# Patient Record
Sex: Female | Born: 1965 | Race: White | Hispanic: No | Marital: Married | Smoking: Never smoker
Health system: Southern US, Community
[De-identification: ages and names within clinical notes are randomized; demographics above are authoritative.]

## PROBLEM LIST (undated history)

## (undated) DIAGNOSIS — J45909 Unspecified asthma, uncomplicated: Secondary | ICD-10-CM

## (undated) DIAGNOSIS — B009 Herpesviral infection, unspecified: Secondary | ICD-10-CM

## (undated) DIAGNOSIS — K649 Unspecified hemorrhoids: Secondary | ICD-10-CM

## (undated) DIAGNOSIS — T7840XA Allergy, unspecified, initial encounter: Secondary | ICD-10-CM

## (undated) HISTORY — PX: BREAST ENHANCEMENT SURGERY: SHX7

## (undated) HISTORY — PX: DILATION AND CURETTAGE OF UTERUS: SHX78

## (undated) HISTORY — DX: Unspecified asthma, uncomplicated: J45.909

## (undated) HISTORY — DX: Unspecified hemorrhoids: K64.9

## (undated) HISTORY — DX: Allergy, unspecified, initial encounter: T78.40XA

## (undated) HISTORY — DX: Herpesviral infection, unspecified: B00.9

---

## 1994-10-29 HISTORY — PX: AUGMENTATION MAMMAPLASTY: SUR837

## 2005-10-01 ENCOUNTER — Encounter (INDEPENDENT_AMBULATORY_CARE_PROVIDER_SITE_OTHER): Payer: Self-pay | Admitting: *Deleted

## 2005-10-01 ENCOUNTER — Ambulatory Visit (HOSPITAL_COMMUNITY): Admission: RE | Admit: 2005-10-01 | Discharge: 2005-10-01 | Payer: Self-pay | Admitting: Obstetrics and Gynecology

## 2007-11-07 ENCOUNTER — Ambulatory Visit (HOSPITAL_COMMUNITY): Admission: RE | Admit: 2007-11-07 | Discharge: 2007-11-07 | Payer: Self-pay | Admitting: Obstetrics and Gynecology

## 2007-11-14 ENCOUNTER — Ambulatory Visit (HOSPITAL_COMMUNITY): Admission: RE | Admit: 2007-11-14 | Discharge: 2007-11-14 | Payer: Self-pay | Admitting: Obstetrics and Gynecology

## 2007-11-14 ENCOUNTER — Encounter (INDEPENDENT_AMBULATORY_CARE_PROVIDER_SITE_OTHER): Payer: Self-pay | Admitting: Obstetrics and Gynecology

## 2008-01-07 ENCOUNTER — Encounter: Admission: RE | Admit: 2008-01-07 | Discharge: 2008-01-07 | Payer: Self-pay | Admitting: Obstetrics and Gynecology

## 2009-03-18 ENCOUNTER — Encounter: Admission: RE | Admit: 2009-03-18 | Discharge: 2009-03-18 | Payer: Self-pay | Admitting: Obstetrics and Gynecology

## 2010-08-02 ENCOUNTER — Encounter: Admission: RE | Admit: 2010-08-02 | Discharge: 2010-08-02 | Payer: Self-pay | Admitting: Obstetrics and Gynecology

## 2010-11-19 ENCOUNTER — Encounter: Payer: Self-pay | Admitting: Obstetrics and Gynecology

## 2011-03-13 NOTE — Op Note (Signed)
NAME:  Tracey Faulkner, Tracey Faulkner NO.:  [id number]   MEDICAL RECORD NO.:  [id number]          PATIENT TYPE:  AMB   LOCATION:  SDC                           FACILITY:  [facility]   PHYSICIAN:  Carrington Clamp, M.D. 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   POSTOPERATIVE DIAGNOSES:  Missed abortion, twins, suspect molar  pregnancy.   PROCEDURE:  Dilation and evacuation.   SURGEON:  Carrington Clamp, M.D.   ASSISTANT:  None.   ANESTHESIA:  MAC.   FINDINGS:  About a 10-week size uterus down to 8 weeks size postop with  good crie.   SPECIMENS:  Uterine contents.  These were marked to pathology with the  note to check entire specimen for molar pregnancy.   ESTIMATED BLOOD LOSS:  50 mL.   IV FLUIDS:  700 mL.   URINE OUTPUT:  Not measured.   COMPLICATIONS:  None.   MEDICATIONS:  Methergine.   TECHNIQUE:  After adequate LMA anesthesia was achieved, the patient was  prepped and draped in the usual sterile fashion in the dorsal lithotomy  position.  The bladder was emptied with a red rubber catheter and the  speculum placed in the vagina.  A single-tooth tenaculum was used to  stabilize the cervix and the cervix was dilated with Shawnie Pons dilators.  A  10 mm curette was passed into the uterus and suction curettage was  performed.  Alternating  sharp and suction curettage revealed that all the products had been  removed and was good crie.  The patient was given a dose of Methergine  and all instruments were withdrawn from the vagina and the patient  returned to the recovery room in stable condition.      Carrington Clamp, M.D.  Electronically Signed     MH/MEDQ  D:  11/14/2007  T:  11/14/2007  Job:  [id number]

## 2011-03-16 NOTE — Op Note (Signed)
NAME:  Tracey Faulkner, Tracey Faulkner NO.:  [id number]   MEDICAL RECORD NO.:  [id number]          PATIENT TYPE:  AMB   LOCATION:  SDC                           FACILITY:  [facility]   PHYSICIAN:  Carrington Clamp, M.D. DATE OF BIRTH:  1965-12-31   DATE OF PROCEDURE:  10/01/2005  DATE OF DISCHARGE:                                 OPERATIVE REPORT   PREOPERATIVE DIAGNOSIS:  Missed AB.   POSTOPERATIVE DIAGNOSIS:  Missed AB.   OPERATION:  Is suction dilation and evacuation.   SURGEON:  Carrington Clamp, M.D.   ASSISTANT:  None.   ANESTHESIA:  Is general.   ESTIMATED BLOOD LOSS:  Is 100 mL.   FLUIDS REPLACED:  400 mL. Urine was not measured.   SPECIMENS:  Uterine contents.   COMPLICATIONS:  None.   FINDINGS:  8-week size uterus down to 6-week size postoperatively with good  crie.   MEDICATIONS:  Ancef and Methergine.   Counts were correct x3.   TECHNIQUE:  After adequate general anesthesia was achieved, the patient was  prepped and draped in usual sterile fashion in dorsal lithotomy position.  After the bladder had been emptied with red rubber catheter, a speculum was  placed in the vagina and the cervix dilated up with Shawnie Pons dilators. A 9 mm  suction curette was then placed into the uterus and suction curettage  performed. Alternating suction and sharp curettage was performed until good  crie was obtained. All instruments were then withdrawn from the vagina and  the patient was returned to recovery room in stable condition.     Carrington Clamp, M.D.  Electronically Signed    MH/MEDQ  D:  10/01/2005  T:  10/01/2005  Job:  [id number]

## 2011-06-27 ENCOUNTER — Other Ambulatory Visit: Payer: Self-pay | Admitting: Obstetrics and Gynecology

## 2011-06-27 DIAGNOSIS — Z1231 Encounter for screening mammogram for malignant neoplasm of breast: Secondary | ICD-10-CM

## 2011-07-19 LAB — URINALYSIS, ROUTINE W REFLEX MICROSCOPIC
Bilirubin Urine: NEGATIVE
Glucose, UA: NEGATIVE
Hgb urine dipstick: NEGATIVE
Ketones, ur: NEGATIVE
Nitrite: NEGATIVE
Protein, ur: NEGATIVE
Specific Gravity, Urine: 1.02
Urobilinogen, UA: 0.2
pH: 7

## 2011-07-19 LAB — CBC
HCT: 37.9
Hemoglobin: 13.1
MCHC: 34.6
MCV: 96.8
Platelets: 191
RBC: 3.92
RDW: 13.5
WBC: 5.3

## 2011-07-19 LAB — ABO/RH: ABO/RH(D): A POS

## 2011-08-13 ENCOUNTER — Ambulatory Visit: Payer: Self-pay

## 2011-08-24 ENCOUNTER — Ambulatory Visit
Admission: RE | Admit: 2011-08-24 | Discharge: 2011-08-24 | Disposition: A | Discharge status: Home / Self Care | Payer: BC Managed Care – PPO | Source: Ambulatory Visit | Attending: Obstetrics and Gynecology | Admitting: Obstetrics and Gynecology

## 2011-08-24 DIAGNOSIS — Z1231 Encounter for screening mammogram for malignant neoplasm of breast: Secondary | ICD-10-CM

## 2012-02-27 LAB — HM MAMMOGRAPHY

## 2012-03-04 ENCOUNTER — Ambulatory Visit (HOSPITAL_BASED_OUTPATIENT_CLINIC_OR_DEPARTMENT_OTHER)
Admission: RE | Admit: 2012-03-04 | Discharge: 2012-03-04 | Disposition: A | Discharge status: Home / Self Care | Payer: BC Managed Care – PPO | Source: Ambulatory Visit | Attending: Family Medicine | Admitting: Family Medicine

## 2012-03-04 ENCOUNTER — Other Ambulatory Visit (HOSPITAL_BASED_OUTPATIENT_CLINIC_OR_DEPARTMENT_OTHER): Payer: Self-pay | Admitting: Family Medicine

## 2012-03-04 DIAGNOSIS — J069 Acute upper respiratory infection, unspecified: Secondary | ICD-10-CM

## 2012-03-04 DIAGNOSIS — R042 Hemoptysis: Secondary | ICD-10-CM

## 2012-03-04 DIAGNOSIS — R918 Other nonspecific abnormal finding of lung field: Secondary | ICD-10-CM

## 2012-03-04 DIAGNOSIS — J45909 Unspecified asthma, uncomplicated: Secondary | ICD-10-CM | POA: Insufficient documentation

## 2012-03-04 DIAGNOSIS — R05 Cough: Secondary | ICD-10-CM

## 2012-03-04 DIAGNOSIS — R059 Cough, unspecified: Secondary | ICD-10-CM | POA: Insufficient documentation

## 2012-04-07 ENCOUNTER — Other Ambulatory Visit (HOSPITAL_BASED_OUTPATIENT_CLINIC_OR_DEPARTMENT_OTHER): Payer: Self-pay | Admitting: Family Medicine

## 2012-04-07 DIAGNOSIS — R9389 Abnormal findings on diagnostic imaging of other specified body structures: Secondary | ICD-10-CM

## 2012-04-09 ENCOUNTER — Ambulatory Visit (HOSPITAL_BASED_OUTPATIENT_CLINIC_OR_DEPARTMENT_OTHER)
Admission: RE | Admit: 2012-04-09 | Discharge: 2012-04-09 | Disposition: A | Discharge status: Home / Self Care | Payer: BC Managed Care – PPO | Source: Ambulatory Visit | Attending: Family Medicine | Admitting: Family Medicine

## 2012-04-09 DIAGNOSIS — R9389 Abnormal findings on diagnostic imaging of other specified body structures: Secondary | ICD-10-CM

## 2012-05-09 ENCOUNTER — Other Ambulatory Visit: Payer: Self-pay | Admitting: Obstetrics and Gynecology

## 2012-05-09 LAB — HM PAP SMEAR

## 2012-07-16 ENCOUNTER — Other Ambulatory Visit: Payer: Self-pay | Admitting: Obstetrics and Gynecology

## 2012-07-16 DIAGNOSIS — Z1231 Encounter for screening mammogram for malignant neoplasm of breast: Secondary | ICD-10-CM

## 2012-07-16 DIAGNOSIS — Z9882 Breast implant status: Secondary | ICD-10-CM

## 2012-08-25 ENCOUNTER — Ambulatory Visit
Admission: RE | Admit: 2012-08-25 | Discharge: 2012-08-25 | Disposition: A | Discharge status: Home / Self Care | Payer: BC Managed Care – PPO | Source: Ambulatory Visit | Attending: Obstetrics and Gynecology | Admitting: Obstetrics and Gynecology

## 2012-08-25 DIAGNOSIS — Z1231 Encounter for screening mammogram for malignant neoplasm of breast: Secondary | ICD-10-CM

## 2012-08-25 DIAGNOSIS — Z9882 Breast implant status: Secondary | ICD-10-CM

## 2013-03-16 ENCOUNTER — Ambulatory Visit (INDEPENDENT_AMBULATORY_CARE_PROVIDER_SITE_OTHER): Payer: 59 | Admitting: Family Medicine

## 2013-03-16 ENCOUNTER — Encounter: Payer: Self-pay | Admitting: Family Medicine

## 2013-03-16 VITALS — BP 96/58 | HR 73 | Resp 16 | Ht 63.0 in | Wt 153.0 lb

## 2013-03-16 DIAGNOSIS — B009 Herpesviral infection, unspecified: Secondary | ICD-10-CM

## 2013-03-16 DIAGNOSIS — E663 Overweight: Secondary | ICD-10-CM

## 2013-03-16 DIAGNOSIS — J45909 Unspecified asthma, uncomplicated: Secondary | ICD-10-CM

## 2013-03-16 DIAGNOSIS — K649 Unspecified hemorrhoids: Secondary | ICD-10-CM

## 2013-03-16 MED ORDER — FAMCICLOVIR 500 MG PO TABS: ORAL_TABLET | ORAL | Status: DC

## 2013-03-16 MED ORDER — ALBUTEROL SULFATE HFA 108 (90 BASE) MCG/ACT IN AERS: 2.0000 | INHALATION_SPRAY | Freq: Four times a day (QID) | RESPIRATORY_TRACT | Status: DC | PRN

## 2013-03-16 MED ORDER — FLUTICASONE-SALMETEROL 250-50 MCG/DOSE IN AEPB: 1.0000 | INHALATION_SPRAY | Freq: Two times a day (BID) | RESPIRATORY_TRACT | Status: AC

## 2013-03-16 MED ORDER — FLUTICASONE-SALMETEROL 100-50 MCG/DOSE IN AEPB: 1.0000 | INHALATION_SPRAY | Freq: Two times a day (BID) | RESPIRATORY_TRACT | Status: AC

## 2013-03-16 NOTE — Progress Notes (Signed)
  Subjective:    Patient ID: Tracey Faulkner, female    DOB: [birth year], 47 y.o.   MRN: [id number]  HPI  Tracey Faulkner is here today to discuss a few issues.    1)  Hemorrhoids:  She has been struggling with this problem for several days.  She has used Preparation H which has helped her some.    2)  Asthma:  She has run out of her asthma medications.     3)  Weight:  She wants to start another round of HCG.  She has been trying to exercise and watch her calorie intake but has gained weight.    Review of Systems  Constitutional: Positive for activity change, appetite change, fatigue and unexpected weight change.  Respiratory: Positive for chest tightness and wheezing.   Cardiovascular: Negative for chest pain and palpitations.   Past Medical History  Diagnosis Date  . Asthma   . HSV-2 infection     Right Ear   . Hemorrhoids     Family History  Problem Relation Age of Onset  . Asthma Sister    History   Social History Narrative   Marital Status:  Married Educational psychologist)   Children:  Sons Romeo Apple, Arthur)    Pets: None   Living Situation: Lives with  spouse and son.     Occupation: Psychologist, sport and exercise Contractor)   Education: Doctor, general practice   Tobacco Use/Exposure:  None    Alcohol Use:  Occasional   Drug Use:  None   Diet:  Regular   Exercise:  None   Hobbies: Business                Objective:   Physical Exam  Constitutional: She appears well-nourished. No distress.  HENT:  Head: Normocephalic.  Mouth/Throat: No oropharyngeal exudate.  Eyes: Conjunctivae are normal. Right eye exhibits no discharge. Left eye exhibits no discharge.  Neck: Neck supple.  Cardiovascular: Normal rate, regular rhythm and normal heart sounds.  Exam reveals no gallop and no friction rub.   No murmur heard. Pulmonary/Chest: Effort normal and breath sounds normal. She has no wheezes. She exhibits no tenderness.  Lymphadenopathy:    She has no cervical adenopathy.  Neurological: She is alert.   Skin: Skin is warm and dry. No rash noted.  Psychiatric: She has a normal mood and affect.      Assessment & Plan:  TIME 30 MINUTES:  MORE THAN 50 % OF TIME WAS INVOLVED IN COUNSELING.

## 2013-03-24 ENCOUNTER — Ambulatory Visit: Payer: 59 | Admitting: *Deleted

## 2013-03-24 VITALS — Wt 147.0 lb

## 2013-03-27 ENCOUNTER — Encounter: Payer: Self-pay | Admitting: *Deleted

## 2013-03-29 ENCOUNTER — Encounter: Payer: Self-pay | Admitting: Family Medicine

## 2013-03-29 DIAGNOSIS — J45909 Unspecified asthma, uncomplicated: Secondary | ICD-10-CM | POA: Insufficient documentation

## 2013-03-29 DIAGNOSIS — E663 Overweight: Secondary | ICD-10-CM | POA: Insufficient documentation

## 2013-03-29 DIAGNOSIS — K649 Unspecified hemorrhoids: Secondary | ICD-10-CM | POA: Insufficient documentation

## 2013-03-29 DIAGNOSIS — B009 Herpesviral infection, unspecified: Secondary | ICD-10-CM | POA: Insufficient documentation

## 2013-03-29 NOTE — Assessment & Plan Note (Signed)
Refilled her Famvir.

## 2013-03-29 NOTE — Assessment & Plan Note (Signed)
The patient is going to begin the "Step By Step" program.  They will take daily IM injections of hCG and will follow the 500 calorie diet as illustrated in Dr. Simeons' "Pounds & Inches".  They will also take Phendimetrazine to suppress appetite. They were also instructed to get at least one hour of exercise daily. 

## 2013-03-29 NOTE — Assessment & Plan Note (Signed)
Refilled her asthma medications.

## 2013-03-30 ENCOUNTER — Ambulatory Visit: Payer: 59 | Admitting: *Deleted

## 2013-03-30 DIAGNOSIS — E669 Obesity, unspecified: Secondary | ICD-10-CM

## 2013-04-08 ENCOUNTER — Ambulatory Visit: Payer: 59 | Admitting: *Deleted

## 2013-04-08 VITALS — Wt 141.0 lb

## 2013-04-08 DIAGNOSIS — E663 Overweight: Secondary | ICD-10-CM

## 2013-04-15 ENCOUNTER — Encounter: Payer: 59 | Admitting: Family Medicine

## 2013-04-24 ENCOUNTER — Ambulatory Visit (INDEPENDENT_AMBULATORY_CARE_PROVIDER_SITE_OTHER): Payer: 59 | Admitting: Family Medicine

## 2013-04-24 VITALS — BP 109/72 | HR 74 | Wt 138.0 lb

## 2013-04-24 DIAGNOSIS — E663 Overweight: Secondary | ICD-10-CM

## 2013-04-24 MED ORDER — PHENTERMINE HCL 37.5 MG PO TABS: 37.5000 mg | ORAL_TABLET | Freq: Every day | ORAL | Status: DC

## 2013-04-24 NOTE — Progress Notes (Signed)
  Subjective:    Patient ID: Tracey Faulkner, female    DOB: June 22, 1966, 47 y.o.   MRN: [id number]  HPI  Tracey Faulkner is here today for a follow up of her weight loss. She has just completed her 4th week of the "Step By Step"  Program.  She is taking Phendimetrazine without any problem. She has also been receiving HCG injections without difficulty.  She has been following the 500 calorie diet and has lost 15 lbs since the beginning of her program.   Review of Systems  Constitutional: Negative for appetite change.  Respiratory: Negative for shortness of breath.   Neurological: Negative for light-headedness.    Past Medical History  Diagnosis Date  . Asthma   . HSV-2 infection     Right Ear   . Hemorrhoids    Family History  Problem Relation Age of Onset  . Asthma Sister     History   Social History Narrative   Marital Status:  Married Educational psychologist)   Children:  Sons Romeo Apple, Arthur)    Pets: None   Living Situation: Lives with  spouse and son.     Occupation: Psychologist, sport and exercise Contractor)   Education: Doctor, general practice   Tobacco Use/Exposure:  None    Alcohol Use:  Occasional   Drug Use:  None   Diet:  Regular   Exercise:  None   Hobbies: Business                Objective:   Physical Exam  Constitutional: She appears well-nourished. No distress.  HENT:  Head: Normocephalic.  Eyes: No scleral icterus.  Neck: No thyromegaly present.  Cardiovascular: Normal rate, regular rhythm and normal heart sounds.   Pulmonary/Chest: Effort normal and breath sounds normal.  Abdominal: There is no tenderness.  Musculoskeletal: She exhibits no edema and no tenderness.  Neurological: She is alert.  Skin: Skin is warm and dry.  Psychiatric: She has a normal mood and affect. Her behavior is normal. Judgment and thought content normal.          Assessment & Plan:

## 2013-05-25 ENCOUNTER — Ambulatory Visit (INDEPENDENT_AMBULATORY_CARE_PROVIDER_SITE_OTHER): Payer: 59 | Admitting: Family Medicine

## 2013-05-25 ENCOUNTER — Encounter: Payer: Self-pay | Admitting: Family Medicine

## 2013-05-25 VITALS — BP 110/80 | HR 75 | Wt 130.0 lb

## 2013-05-25 DIAGNOSIS — B373 Candidiasis of vulva and vagina: Secondary | ICD-10-CM

## 2013-05-25 DIAGNOSIS — L03019 Cellulitis of unspecified finger: Secondary | ICD-10-CM

## 2013-05-25 DIAGNOSIS — Z23 Encounter for immunization: Secondary | ICD-10-CM

## 2013-05-25 DIAGNOSIS — B3731 Acute candidiasis of vulva and vagina: Secondary | ICD-10-CM

## 2013-05-25 DIAGNOSIS — L03012 Cellulitis of left finger: Secondary | ICD-10-CM

## 2013-05-25 MED ORDER — TERCONAZOLE 0.8 % VA CREA: 1.0000 | TOPICAL_CREAM | Freq: Every day | VAGINAL | Status: DC

## 2013-05-25 MED ORDER — CEPHALEXIN 500 MG PO CAPS: 500.0000 mg | ORAL_CAPSULE | Freq: Four times a day (QID) | ORAL | Status: AC

## 2013-05-25 MED ORDER — PHENDIMETRAZINE TARTRATE 35 MG PO TABS: 1.0000 | ORAL_TABLET | Freq: Three times a day (TID) | ORAL | Status: DC

## 2013-05-25 MED ORDER — MUPIROCIN 2 % EX OINT: TOPICAL_OINTMENT | CUTANEOUS | Status: DC

## 2013-05-25 MED ORDER — MUPIROCIN CALCIUM 2 % EX CREA: TOPICAL_CREAM | CUTANEOUS | Status: DC

## 2013-05-25 NOTE — Progress Notes (Signed)
  Subjective:    Patient ID: Tracey Faulkner, female    DOB: [birth year], 47 y.o.   MRN: [id number]  HPI  Tracey Faulkner is here today complaining of swelling, pain and discharge from her left ring finger over the past week.  She is not sure how it happened but wonders if she got some infection from the salon where she gets her nails done.  She has been using Mupirocin ointment on it but can't notice that it is doing much.  She does not know when she ever got a flu shot.     Review of Systems  Constitutional: Negative.   HENT: Negative.   Eyes: Negative.   Respiratory: Negative.   Cardiovascular: Negative.   Gastrointestinal: Negative.   Endocrine: Negative.   Genitourinary: Negative.   Musculoskeletal: Negative.   Skin: Positive for wound.       Left ring finger is red and swollen   Allergic/Immunologic: Negative.   Neurological: Negative.   Hematological: Negative.   Psychiatric/Behavioral: Negative.     Past Medical History  Diagnosis Date  . Asthma   . HSV-2 infection     Right Ear   . Hemorrhoids     Family History  Problem Relation Age of Onset  . Asthma Sister    History   Social History Narrative   Marital Status:  Married Educational psychologist)   Children:  Sons Romeo Apple, Arthur)    Pets: None   Living Situation: Lives with  spouse and son.     Occupation: Psychologist, sport and exercise Contractor)   Education: Doctor, general practice   Tobacco Use/Exposure:  None    Alcohol Use:  Occasional   Drug Use:  None   Diet:  Regular   Exercise:  None   Hobbies: Business                 Objective:   Physical Exam  Constitutional: She appears well-nourished. No distress.  Cardiovascular: Normal rate, regular rhythm and normal heart sounds.   Pulmonary/Chest: Breath sounds normal. No respiratory distress.  Skin: Skin is warm and dry. Rash: Area around her nail appears to be inflamed.   There is erythema.  Psychiatric: She has a normal mood and affect. Her behavior is normal. Judgment and thought  content normal.          Assessment & Plan:

## 2013-05-25 NOTE — Patient Instructions (Addendum)
1)  Infected Finger- Soak finger in warm water with Epsom Salt 2 x per day. Apply the ointment vs cream 3 times per day.  If it continues to worsen then start on the Keflex 4 x per day for 7 days.  You received a TDAP today which is good for 10 years.

## 2013-05-26 ENCOUNTER — Encounter: Payer: Self-pay | Admitting: Family Medicine

## 2013-05-26 DIAGNOSIS — Z23 Encounter for immunization: Secondary | ICD-10-CM | POA: Insufficient documentation

## 2013-05-26 DIAGNOSIS — L03019 Cellulitis of unspecified finger: Secondary | ICD-10-CM | POA: Insufficient documentation

## 2013-05-26 DIAGNOSIS — B3731 Acute candidiasis of vulva and vagina: Secondary | ICD-10-CM | POA: Insufficient documentation

## 2013-05-26 DIAGNOSIS — B373 Candidiasis of vulva and vagina: Secondary | ICD-10-CM | POA: Insufficient documentation

## 2013-05-26 NOTE — Assessment & Plan Note (Signed)
She received her Tdap without difficulty.   

## 2013-05-26 NOTE — Assessment & Plan Note (Signed)
She was given some Terazol just in case she develops a yeast infection.

## 2013-05-26 NOTE — Assessment & Plan Note (Signed)
She is to soak her finger in warm water and Epsom Salt and apply the Bactroban cream vs ointment 3 times per day.  If the redness continues or worsens, she will take the Keflex.

## 2013-05-29 ENCOUNTER — Encounter: Payer: Self-pay | Admitting: Family Medicine

## 2013-05-29 ENCOUNTER — Ambulatory Visit (INDEPENDENT_AMBULATORY_CARE_PROVIDER_SITE_OTHER): Payer: 59 | Admitting: Family Medicine

## 2013-05-29 VITALS — BP 123/77 | HR 74 | Resp 16 | Ht 63.0 in | Wt 130.0 lb

## 2013-05-29 DIAGNOSIS — R070 Pain in throat: Secondary | ICD-10-CM

## 2013-05-29 LAB — POCT RAPID STREP A (OFFICE): Rapid Strep A Screen: NEGATIVE

## 2013-05-29 MED ORDER — MAGIC MOUTHWASH: 5.0000 mL | Freq: Four times a day (QID) | ORAL | Status: DC | PRN

## 2013-05-29 NOTE — Progress Notes (Signed)
  Subjective:    Patient ID: Tracey Faulkner, female    DOB: [birth year], 47 y.o.   MRN: [id number]    HPI  Tracey Faulkner is in today complaining of a sore throat that started yesterday. She describes her pain as being moderate in nature and she feels that she is worsening.  She was in a few days ago for an infected finger.  Her finger has improved.  She did start on the Keflex.  She does not know of any strep exposure.     Review of Systems  Constitutional: Positive for fatigue. Negative for fever.  HENT: Positive for sore throat. Negative for rhinorrhea and voice change.   Eyes: Negative.   Cardiovascular: Negative for chest pain.  Genitourinary: Negative.   Hematological: Positive for adenopathy.  Psychiatric/Behavioral: Negative.     Past Medical History  Diagnosis Date  . Asthma   . HSV-2 infection     Right Ear   . Hemorrhoids     Family History  Problem Relation Age of Onset  . Asthma Sister     History   Social History Narrative   Marital Status:  Married Educational psychologist)   Children:  Sons Romeo Apple, Arthur)    Pets: None   Living Situation: Lives with  spouse and sons    Occupation: Psychologist, sport and exercise Contractor)   Education: Doctor, general practice   Tobacco Use/Exposure:  None    Alcohol Use:  Occasional   Drug Use:  None   Diet:  Regular   Exercise:  None   Hobbies: Business                      Objective:   Physical Exam  Constitutional: She appears well-nourished. No distress.  HENT:  Head: Normocephalic.  Mouth/Throat: No oropharyngeal exudate.  Eyes: Conjunctivae are normal. Right eye exhibits no discharge. Left eye exhibits no discharge.  Neck: Neck supple.  Cardiovascular: Normal rate, regular rhythm and normal heart sounds.  Exam reveals no gallop and no friction rub.   No murmur heard. Pulmonary/Chest: Effort normal and breath sounds normal. She has no wheezes. She exhibits no tenderness.  Lymphadenopathy:    She has no cervical adenopathy.   Neurological: She is alert.  Skin: Skin is warm and dry. No rash noted.  Psychiatric: She has a normal mood and affect.          Assessment & Plan:

## 2013-05-29 NOTE — Patient Instructions (Addendum)
1) Throat Pain - Go ahead and take the Keflex at least 3 times per day till gone. Gargle with the Duke's Magic Mouthwash up to 4 times per day; Ibuprofen 600 mg plus Tylenol 1000 mg up to 3 times per day.  Change toothbrush.     Viral and Bacterial Pharyngitis Pharyngitis is soreness (inflammation) or infection of the pharynx. It is also called a sore throat. CAUSES  Most sore throats are caused by viruses and are part of a cold. However, some sore throats are caused by strep and other bacteria. Sore throats can also be caused by post nasal drip from draining sinuses, allergies and sometimes from sleeping with an open mouth. Infectious sore throats can be spread from person to person by coughing, sneezing and sharing cups or eating utensils. TREATMENT  Sore throats that are viral usually last 3-4 days. Viral illness will get better without medications (antibiotics). Strep throat and other bacterial infections will usually begin to get better about 24-48 hours after you begin to take antibiotics. HOME CARE INSTRUCTIONS   If the caregiver feels there is a bacterial infection or if there is a positive strep test, they will prescribe an antibiotic. The full course of antibiotics must be taken. If the full course of antibiotic is not taken, you or your child may become ill again. If you or your child has strep throat and do not finish all of the medication, serious heart or kidney diseases may develop.  Drink enough water and fluids to keep your urine clear or pale yellow.  Only take over-the-counter or prescription medicines for pain, discomfort or fever as directed by your caregiver.  Get lots of rest.  Gargle with salt water ( tsp. of salt in a glass of water) as often as every 1-2 hours as you need for comfort.  Hard candies may soothe the throat if individual is not at risk for choking. Throat sprays or lozenges may also be used. SEEK MEDICAL CARE IF:   Large, tender lumps in the neck  develop.  A rash develops.  Green, yellow-brown or bloody sputum is coughed up.  Your baby is older than 3 months with a rectal temperature of 100.5 F (38.1 C) or higher for more than 1 day. SEEK IMMEDIATE MEDICAL CARE IF:   A stiff neck develops.  You or your child are drooling or unable to swallow liquids.  You or your child are vomiting, unable to keep medications or liquids down.  You or your child has severe pain, unrelieved with recommended medications.  You or your child are having difficulty breathing (not due to stuffy nose).  You or your child are unable to fully open your mouth.  You or your child develop redness, swelling, or severe pain anywhere on the neck.  You have a fever.  Your baby is older than 3 months with a rectal temperature of 102 F (38.9 C) or higher.  Your baby is 25 months old or younger with a rectal temperature of 100.4 F (38 C) or higher. MAKE SURE YOU:   Understand these instructions.  Will watch your condition.  Will get help right away if you are not doing well or get worse. Document Released: 10/15/2005 Document Revised: 01/07/2012 Document Reviewed: 01/12/2008 [facility] Patient Information 2014 [place], [place].

## 2013-06-05 DIAGNOSIS — E663 Overweight: Secondary | ICD-10-CM | POA: Insufficient documentation

## 2013-06-05 NOTE — Assessment & Plan Note (Addendum)
She has done well on Phase II of the HCG diet. She is ready to move on to Phase III where she will limit her intake of starch and sugar and calories to 1000.  She was given a prescription for phentermine.

## 2013-07-18 ENCOUNTER — Encounter: Payer: Self-pay | Admitting: Family Medicine

## 2013-07-18 DIAGNOSIS — R070 Pain in throat: Secondary | ICD-10-CM | POA: Insufficient documentation

## 2013-07-18 NOTE — Assessment & Plan Note (Addendum)
Since she has taken some of the Keflex, it is hard to know for sure if this negative rapid strep is totally accurate so she is going to complete the full prescription of the Keflex.  She was given a prescription for Duke's Magic Mouthwash.

## 2013-07-20 ENCOUNTER — Other Ambulatory Visit: Payer: Self-pay

## 2013-07-20 DIAGNOSIS — Z1231 Encounter for screening mammogram for malignant neoplasm of breast: Secondary | ICD-10-CM

## 2013-08-20 ENCOUNTER — Encounter: Payer: Self-pay | Admitting: *Deleted

## 2013-09-18 ENCOUNTER — Encounter: Payer: Self-pay | Admitting: Family Medicine

## 2013-09-18 ENCOUNTER — Ambulatory Visit (INDEPENDENT_AMBULATORY_CARE_PROVIDER_SITE_OTHER): Payer: 59 | Admitting: Family Medicine

## 2013-09-18 VITALS — BP 107/68 | HR 60 | Resp 16 | Wt 130.0 lb

## 2013-09-18 DIAGNOSIS — M542 Cervicalgia: Secondary | ICD-10-CM

## 2013-09-18 DIAGNOSIS — R5381 Other malaise: Secondary | ICD-10-CM

## 2013-09-18 MED ORDER — SYRINGE (DISPOSABLE) 1 ML MISC: 1.0000 | Status: DC

## 2013-09-18 MED ORDER — CYANOCOBALAMIN 1000 MCG/ML IJ SOLN: 1000.0000 ug | Freq: Once | INTRAMUSCULAR | Status: DC

## 2013-09-18 MED ORDER — DICLOFENAC SODIUM 1 % TD GEL: 4.0000 g | Freq: Four times a day (QID) | TRANSDERMAL | Status: DC

## 2013-09-18 MED ORDER — PHENDIMETRAZINE TARTRATE 35 MG PO TABS: 1.0000 | ORAL_TABLET | Freq: Three times a day (TID) | ORAL | Status: DC | PRN

## 2013-09-18 NOTE — Progress Notes (Signed)
  Subjective:    Patient ID: Tracey Faulkner, female    DOB: 17-Dec-1965, 47 y.o.   MRN: [id number]  HPI  Tracey Faulkner is here today to discuss a few issues.  She would like a plan to manage her weight over the holidays.  She wants to plan ahead and not gain like she has in previous years.  She wants to keep her weight down so she won't have to redo the HCG diet another time.  She feels that an appetite suppressant will help her control her holiday eating.  She would also like to get a Vitamin B-12 shot.  She feels fatigued and thinks the B-12 gives her more energy.  She has also been having pain in her neck. She has had a massage which helped some.     Review of Systems  Constitutional: Positive for fatigue.  HENT: Negative.   Eyes: Negative.   Respiratory: Negative.   Cardiovascular: Negative.   Gastrointestinal: Negative.   Endocrine: Negative.   Genitourinary: Negative.   Musculoskeletal: Negative.   Skin: Negative.   Allergic/Immunologic: Negative.   Neurological: Negative.   Hematological: Negative.   Psychiatric/Behavioral: Negative.      Past Medical History  Diagnosis Date  . Asthma   . HSV-2 infection     Right Ear   . Hemorrhoids      Family History  Problem Relation Age of Onset  . Asthma Sister      History   Social History Narrative   Marital Status:  Married Educational psychologist)   Children:  Sons Tracey Faulkner, Tracey Faulkner)    Pets: None   Living Situation: Lives with  spouse and sons    Occupation: Psychologist, sport and exercise Contractor)   Education: Doctor, general practice   Tobacco Use/Exposure:  None    Alcohol Use:  Occasional   Drug Use:  None   Diet:  Regular   Exercise:  None   Hobbies: Business                    Objective:   Physical Exam  Vitals reviewed. Constitutional: She is oriented to person, place, and time. She appears well-developed and well-nourished. No distress.  HENT:  Head: Normocephalic.  Eyes: No scleral icterus.  Neck: No thyromegaly present.   Cardiovascular: Normal rate, regular rhythm and normal heart sounds.   Pulmonary/Chest: Effort normal and breath sounds normal.  Abdominal: There is no tenderness.  Musculoskeletal: She exhibits no edema and no tenderness.  Neurological: She is alert and oriented to person, place, and time.  Skin: Skin is warm and dry.  Psychiatric: She has a normal mood and affect. Her behavior is normal. Judgment and thought content normal.          Assessment & Plan:

## 2013-09-19 DIAGNOSIS — R5381 Other malaise: Secondary | ICD-10-CM | POA: Insufficient documentation

## 2013-09-19 DIAGNOSIS — M542 Cervicalgia: Secondary | ICD-10-CM | POA: Insufficient documentation

## 2013-09-19 NOTE — Patient Instructions (Signed)
1)  Fatigue - Vitamin B-12 (1 ml - full syringe) into muscle (thigh or upper arm) monthly.  2)   Neck Pain - Apply 4 grams of Voltaren Gel up to 4 x per day.   3)  Weight - Take one phendimetrazine daily.   Cervical Sprain A cervical sprain is an injury in the neck in which the ligaments are stretched or torn. The ligaments are the tissues that hold the bones of the neck (vertebrae) in place.Cervical sprains can range from very mild to very severe. Most cervical sprains get better in 1 to 3 weeks, but it depends on the cause and extent of the injury. Severe cervical sprains can cause the neck vertebrae to be unstable. This can lead to damage of the spinal cord and can result in serious nervous system problems. Your caregiver will determine whether your cervical sprain is mild or severe. CAUSES  Severe cervical sprains may be caused by:  Contact sport injuries (football, rugby, wrestling, hockey, auto racing, gymnastics, diving, martial arts, boxing).  Motor vehicle collisions.  Whiplash injuries. This means the neck is forcefully whipped backward and forward.  Falls. Mild cervical sprains may be caused by:   Awkward positions, such as cradling a telephone between your ear and shoulder.  Sitting in a chair that does not offer proper support.  Working at a poorly Marketing executive station.  Activities that require looking up or down for long periods of time. SYMPTOMS   Pain, soreness, stiffness, or a burning sensation in the front, back, or sides of the neck. This discomfort may develop immediately after injury or it may develop slowly and not begin for 24 hours or more after an injury.  Pain or tenderness directly in the middle of the back of the neck.  Shoulder or upper back pain.  Limited ability to move the neck.  Headache.  Dizziness.  Weakness, numbness, or tingling in the hands or arms.  Muscle spasms.  Difficulty swallowing or chewing.  Tenderness and swelling  of the neck. DIAGNOSIS  Most of the time, your caregiver can diagnose this problem by taking your history and doing a physical exam. Your caregiver will ask about any known problems, such as arthritis in the neck or a previous neck injury. X-rays may be taken to find out if there are any other problems, such as problems with the bones of the neck. However, an X-ray often does not reveal the full extent of a cervical sprain. Other tests such as a computed tomography (CT) scan or magnetic resonance imaging (MRI) may be needed. TREATMENT  Treatment depends on the severity of the cervical sprain. Mild sprains can be treated with rest, keeping the neck in place (immobilization), and pain medicines. Severe cervical sprains need immediate immobilization and an appointment with an orthopedist or neurosurgeon. Several treatment options are available to help with pain, muscle spasms, and other symptoms. Your caregiver may prescribe:  Medicines, such as pain relievers, numbing medicines, or muscle relaxants.  Physical therapy. This can include stretching exercises, strengthening exercises, and posture training. Exercises and improved posture can help stabilize the neck, strengthen muscles, and help stop symptoms from returning.  A neck collar to be worn for short periods of time. Often, these collars are worn for comfort. However, certain collars may be worn to protect the neck and prevent further worsening of a serious cervical sprain. HOME CARE INSTRUCTIONS   Put ice on the injured area.  Put ice in a plastic bag.  Place a  towel between your skin and the bag.  Leave the ice on for 15-20 minutes, 03-04 times a day.  Only take over-the-counter or prescription medicines for pain, discomfort, or fever as directed by your caregiver.  Keep all follow-up appointments as directed by your caregiver.  Keep all physical therapy appointments as directed by your caregiver.  If a neck collar is prescribed, wear  it as directed by your caregiver.  Do not drive while wearing a neck collar.  Make any needed adjustments to your work station to promote good posture.  Avoid positions and activities that make your symptoms worse.  Warm up and stretch before being active to help prevent problems. SEEK MEDICAL CARE IF:   Your pain is not controlled with medicine.  You are unable to decrease your pain medicine over time as planned.  Your activity level is not improving as expected. SEEK IMMEDIATE MEDICAL CARE IF:   You develop any bleeding, stomach upset, or signs of an allergic reaction to your medicine.  Your symptoms get worse.  You develop new, unexplained symptoms.  You have numbness, tingling, weakness, or paralysis in any part of your body. MAKE SURE YOU:   Understand these instructions.  Will watch your condition.  Will get help right away if you are not doing well or get worse. Document Released: 08/12/2007 Document Revised: 01/07/2012 Document Reviewed: 04/22/2013 [facility] Patient Information 2014 [place], [place].

## 2013-09-19 NOTE — Assessment & Plan Note (Signed)
She is to take a B-12 shot monthly to help with her fatigue.

## 2013-09-19 NOTE — Assessment & Plan Note (Signed)
She was given a prescription for Voltaren Gel.

## 2013-10-26 ENCOUNTER — Ambulatory Visit
Admission: RE | Admit: 2013-10-26 | Discharge: 2013-10-26 | Disposition: A | Discharge status: Home / Self Care | Payer: 59 | Source: Ambulatory Visit

## 2013-10-26 DIAGNOSIS — Z1231 Encounter for screening mammogram for malignant neoplasm of breast: Secondary | ICD-10-CM

## 2014-03-15 ENCOUNTER — Ambulatory Visit: Payer: 59 | Admitting: Family Medicine

## 2014-03-19 ENCOUNTER — Ambulatory Visit: Payer: 59 | Admitting: Family Medicine

## 2014-03-23 ENCOUNTER — Encounter: Payer: Self-pay | Admitting: Family Medicine

## 2014-03-23 ENCOUNTER — Ambulatory Visit (INDEPENDENT_AMBULATORY_CARE_PROVIDER_SITE_OTHER): Payer: 59 | Admitting: Family Medicine

## 2014-03-23 VITALS — BP 119/74 | HR 69 | Resp 16 | Ht 64.0 in | Wt 144.0 lb

## 2014-03-23 DIAGNOSIS — R635 Abnormal weight gain: Secondary | ICD-10-CM

## 2014-03-23 DIAGNOSIS — J45909 Unspecified asthma, uncomplicated: Secondary | ICD-10-CM

## 2014-03-23 MED ORDER — MONTELUKAST SODIUM 10 MG PO TABS: 10.0000 mg | ORAL_TABLET | Freq: Every day | ORAL | Status: AC

## 2014-03-23 MED ORDER — PHENDIMETRAZINE TARTRATE 35 MG PO TABS: 1.0000 | ORAL_TABLET | Freq: Three times a day (TID) | ORAL | Status: DC

## 2014-03-23 MED ORDER — ALBUTEROL SULFATE HFA 108 (90 BASE) MCG/ACT IN AERS: 2.0000 | INHALATION_SPRAY | Freq: Four times a day (QID) | RESPIRATORY_TRACT | Status: AC | PRN

## 2014-03-23 NOTE — Progress Notes (Signed)
   Subjective:    Patient ID: Tracey Faulkner, female    DOB: January 16, 1966, 48 y.o.   MRN: [id number]  HPI  Tracey Faulkner is here today for a couple of issues. Tracey Faulkner needs to have Tracey Faulkner Singulair and Proventil refilled.  Tracey Faulkner would also like to do another round of HCG to get the weight off that Tracey Faulkner has regained over the winter.      Review of Systems  Constitutional: Negative for activity change, appetite change, fatigue and unexpected weight change.  Cardiovascular: Negative for chest pain, palpitations and leg swelling.  Psychiatric/Behavioral: Negative for behavioral problems. The patient is not nervous/anxious.   All other systems reviewed and are negative.    Past Medical History  Diagnosis Date  . Asthma   . HSV-2 infection     Right Ear   . Hemorrhoids   . Allergy      Past Surgical History  Procedure Laterality Date  . Dilation and curettage of uterus    . Breast enhancement surgery       History   Social History Narrative   Marital Status:  Married Educational psychologist)   Children:  Sons Romeo Apple, Arthur)    Pets: None   Living Situation: Lives with  spouse and sons    Occupation: Psychologist, sport and exercise Contractor)   Education: Doctor, general practice   Tobacco Use/Exposure:  None    Alcohol Use:  Occasional   Drug Use:  None   Diet:  Regular   Exercise:  None   Hobbies: Business                  Family History  Problem Relation Age of Onset  . Asthma Sister   . Osteoporosis Mother      No Known Allergies   Immunization History  Administered Date(s) Administered  . Influenza-Unspecified 08/19/2013  . Pneumococcal-Unspecified 05/28/2012  . Tdap 05/25/2013       Objective:   Physical Exam  Nursing note and vitals reviewed. Constitutional: Tracey Faulkner appears well-nourished. No distress.  HENT:  Head: Normocephalic.  Eyes: No scleral icterus.  Neck: No thyromegaly present.  Cardiovascular: Normal rate, regular rhythm and normal heart sounds.   Pulmonary/Chest: Effort normal  and breath sounds normal.  Abdominal: There is no tenderness.  Musculoskeletal: Tracey Faulkner exhibits no edema and no tenderness.  Neurological: Tracey Faulkner is alert.  Skin: Skin is warm and dry.  Psychiatric: Tracey Faulkner has a normal mood and affect. Tracey Faulkner behavior is normal. Judgment and thought content normal.      Assessment & Plan:    Shameya was seen today for medication management.  Diagnoses and associated orders for this visit:  Unspecified asthma(493.90) - montelukast (SINGULAIR) 10 MG tablet; Take 1 tablet (10 mg total) by mouth at bedtime. - albuterol (PROVENTIL HFA;VENTOLIN HFA) 108 (90 BASE) MCG/ACT inhaler; Inhale 2 puffs into the lungs every 6 (six) hours as needed for wheezing or shortness of breath.  Weight gain Comments: Tracey Faulkner is going to do another 30 days of the HCG diet.   - Phendimetrazine Tartrate 35 MG TABS; Take 1 tablet (35 mg total) by mouth 3 (three) times daily before meals.

## 2014-04-22 ENCOUNTER — Ambulatory Visit: Payer: 59 | Admitting: Family Medicine

## 2014-04-23 ENCOUNTER — Encounter: Payer: Self-pay | Admitting: Family Medicine

## 2014-04-23 ENCOUNTER — Ambulatory Visit (INDEPENDENT_AMBULATORY_CARE_PROVIDER_SITE_OTHER): Payer: 59 | Admitting: Family Medicine

## 2014-04-23 VITALS — Resp 16 | Ht 64.0 in | Wt 139.0 lb

## 2014-04-23 DIAGNOSIS — R41840 Attention and concentration deficit: Secondary | ICD-10-CM

## 2014-04-23 MED ORDER — LISDEXAMFETAMINE DIMESYLATE 50 MG PO CAPS: 50.0000 mg | ORAL_CAPSULE | Freq: Every day | ORAL | Status: DC

## 2014-04-23 NOTE — Progress Notes (Signed)
   Subjective:    Patient ID: Tracey Faulkner, female    DOB: 01-14-1966, 48 y.o.   MRN: [id number]  HPI  Tracey Faulkner is here today to follow up on her weight loss and to discuss her concentration.  She has lost 5 pounds since her last visit. One issue she would like to discuss is her concentration.  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     Review of Systems  Constitutional: Negative for activity change, appetite change, fatigue and unexpected weight change.  Cardiovascular: Negative for chest pain, palpitations and leg swelling.  Psychiatric/Behavioral: Positive for decreased concentration. Negative for behavioral problems. The patient is not nervous/anxious.   All other systems reviewed and are negative.    Past Medical History  Diagnosis Date  . Asthma   . HSV-2 infection     Right Ear   . Hemorrhoids   . Allergy      Past Surgical History  Procedure Laterality Date  . Dilation and curettage of uterus    . 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 Asthma Sister   . Osteoporosis Mother      Current Outpatient Prescriptions on File Prior to Visit  Medication Sig Dispense Refill  . albuterol (PROVENTIL HFA;VENTOLIN HFA) 108 (90 BASE) MCG/ACT inhaler Inhale 2 puffs into the lungs every 6 (six) hours as needed for wheezing or shortness of breath.  1 Inhaler  11  . montelukast (SINGULAIR) 10 MG tablet Take 1 tablet (10 mg total) by mouth at bedtime.  30 tablet  11   No  current facility-administered medications on file prior to visit.     No Known Allergies   Immunization History  Administered Date(s) Administered  . Influenza-Unspecified 08/19/2013  . Pneumococcal-Unspecified 05/28/2012  . Tdap 05/25/2013       Objective:   Physical Exam  Nursing note and vitals reviewed. Constitutional: She appears well-nourished. No distress.  HENT:  Head: Normocephalic.  Eyes: No scleral icterus.  Neck: No thyromegaly present.  Cardiovascular: Normal rate, regular rhythm and normal heart sounds.   Pulmonary/Chest: Effort normal and breath sounds normal.  Abdominal: There is no tenderness.  Musculoskeletal: She exhibits no edema and no tenderness.  Neurological: She is alert.  Skin: Skin is warm and dry.  Psychiatric: She has a normal mood and affect. Her behavior is normal. Judgment and thought content normal.      Assessment & Plan:    Tracey Faulkner was seen today for concentration.  Diagnoses and associated orders for this visit:  Concentration deficit -         lisdexamfetamine (VYVANSE) 50 MG capsule; Take 1 capsule (50 mg total) by mouth daily.

## 2014-05-26 ENCOUNTER — Ambulatory Visit (INDEPENDENT_AMBULATORY_CARE_PROVIDER_SITE_OTHER): Payer: 59 | Admitting: Family Medicine

## 2014-05-26 ENCOUNTER — Encounter: Payer: Self-pay | Admitting: Family Medicine

## 2014-05-26 VITALS — BP 112/64 | HR 78 | Resp 16 | Ht 64.0 in | Wt 134.0 lb

## 2014-05-26 DIAGNOSIS — R05 Cough: Secondary | ICD-10-CM

## 2014-05-26 DIAGNOSIS — J45909 Unspecified asthma, uncomplicated: Secondary | ICD-10-CM

## 2014-05-26 DIAGNOSIS — R059 Cough, unspecified: Secondary | ICD-10-CM

## 2014-05-26 DIAGNOSIS — R41840 Attention and concentration deficit: Secondary | ICD-10-CM

## 2014-05-26 MED ORDER — FLUTICASONE FUROATE-VILANTEROL 100-25 MCG/INH IN AEPB: 1.0000 | INHALATION_SPRAY | Freq: Every day | RESPIRATORY_TRACT | Status: AC

## 2014-05-26 MED ORDER — FLUTICASONE-SALMETEROL 100-50 MCG/DOSE IN AEPB: 1.0000 | INHALATION_SPRAY | Freq: Two times a day (BID) | RESPIRATORY_TRACT | Status: AC

## 2014-05-26 MED ORDER — HYDROCOD POLST-CHLORPHEN POLST 10-8 MG/5ML PO LQCR: 5.0000 mL | Freq: Two times a day (BID) | ORAL | Status: AC | PRN

## 2014-05-26 MED ORDER — BENZONATATE 200 MG PO CAPS: 200.0000 mg | ORAL_CAPSULE | Freq: Three times a day (TID) | ORAL | Status: DC | PRN

## 2014-05-26 MED ORDER — LISDEXAMFETAMINE DIMESYLATE 50 MG PO CAPS: 50.0000 mg | ORAL_CAPSULE | Freq: Every day | ORAL | Status: AC

## 2014-05-26 MED ORDER — AZITHROMYCIN 500 MG PO TABS: 500.0000 mg | ORAL_TABLET | Freq: Every day | ORAL | Status: AC

## 2014-05-26 MED ORDER — FLUTICASONE-SALMETEROL 250-50 MCG/DOSE IN AEPB: 1.0000 | INHALATION_SPRAY | Freq: Two times a day (BID) | RESPIRATORY_TRACT | Status: AC

## 2014-05-26 MED ORDER — LISDEXAMFETAMINE DIMESYLATE 50 MG PO CAPS: 50.0000 mg | ORAL_CAPSULE | Freq: Every day | ORAL | Status: DC

## 2014-05-26 NOTE — Progress Notes (Signed)
Subjective:    Patient ID: Tracey Faulkner, female    DOB: 03-Apr-1966, 48 y.o.   MRN: [id number]  HPI  Tracey Faulkner is here today to follow up on her medications. She has been on Vyvanse for one month. She feels that it has really helped her and she would like to remain on it. She s eating and sleeping well. She would also like to get a refill of her Advair.  She has also been having URI symptoms for the past several days.  Her youngest son has just gotten over a cold.     Review of Systems  Constitutional: Negative for activity change, appetite change and fatigue.  HENT: Positive for congestion.   Respiratory: Positive for cough.   Psychiatric/Behavioral: Negative for decreased concentration.  All other systems reviewed and are negative.    Past Medical History  Diagnosis Date  . Asthma   . HSV-2 infection     Right Ear   . Hemorrhoids   . Allergy      Past Surgical History  Procedure Laterality Date  . Dilation and curettage of uterus    . 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 Asthma Sister   . Osteoporosis Mother      Current Outpatient Prescriptions on File Prior to Visit  Medication Sig Dispense Refill  . albuterol (PROVENTIL HFA;VENTOLIN HFA) 108 (90 BASE) MCG/ACT inhaler Inhale 2 puffs into the lungs every 6 (six) hours as needed for wheezing or shortness of breath.  1 Inhaler  11  . montelukast (SINGULAIR) 10 MG tablet Take 1 tablet (10 mg total) by mouth at bedtime.  30 tablet  11   No current facility-administered  medications on file prior to visit.     No Known Allergies   Immunization History  Administered Date(s) Administered  . Influenza-Unspecified 08/19/2013  . Pneumococcal-Unspecified 05/28/2012  . Tdap 05/25/2013       Objective:   Physical Exam  Nursing note and vitals reviewed. Constitutional: She appears well-nourished. No distress.  HENT:  Head: Normocephalic.  Eyes: No scleral icterus.  Neck: No thyromegaly present.  Cardiovascular: Normal rate, regular rhythm and normal heart sounds.   Pulmonary/Chest: Effort normal and breath sounds normal.  Abdominal: There is no tenderness.  Musculoskeletal: She exhibits no edema and no tenderness.  Neurological: She is alert.  Skin: Skin is warm and dry.  Psychiatric: She has a normal mood and affect. Her behavior is normal. Judgment and thought content normal.      Assessment & Plan:    Tracey Faulkner was seen today for medication management and medical management of chronic issues.  Diagnoses and associated orders for this visit:  Unspecified asthma(493.90) - Fluticasone-Salmeterol (ADVAIR DISKUS) 100-50 MCG/DOSE AEPB; Inhale 1 puff into the lungs 2 (two) times daily. - Fluticasone-Salmeterol (ADVAIR DISKUS) 250-50 MCG/DOSE AEPB; Inhale 1 puff into the lungs 2 (two)  times daily. - Fluticasone Furoate-Vilanterol 100-25 MCG/INH AEPB; Inhale 1 puff into the lungs daily.  Concentration deficit - lisdexamfetamine (VYVANSE) 50 MG capsule; Take 1 capsule (50 mg total) by mouth daily. Refilled x 3 months.   Cough - benzonatate (TESSALON) 200 MG capsule; Take 1 capsule (200 mg total) by mouth 3 (three) times daily as needed for cough. - chlorpheniramine-HYDROcodone (TUSSIONEX PENNKINETIC ER) 10-8 MG/5ML LQCR; Take 5 mLs by mouth every 12 (twelve) hours as needed. - azithromycin (ZITHROMAX) 500 MG tablet; Take 1 tablet (500 mg total) by mouth daily. Take 1 tablet daily for 3 days.

## 2014-05-26 NOTE — Patient Instructions (Addendum)
1)  Chest Congestion/Cough  [facility] Cold Care  Mucinex DM (DM = Delsym) 1200/60 1 tab twice a day (Mucinex DM is better for productive cough vs Delsym for dry cough) Tessalon Perles  Tussionex Cough Syrup   If you are 2 weeks into this and worsening instead of getting better then take the Zithromax.    2)  Asthma -   250/50 twice a day till better then go to the 100/50.  Check with the pharmacist to see how your insurance covers the new drug Breo.     Cough, Adult  A cough is a reflex that helps clear your throat and airways. It can help heal the body or may be a reaction to an irritated airway. A cough may only last 2 or 3 weeks (acute) or may last more than 8 weeks (chronic).  CAUSES Acute cough:  Viral or bacterial infections. Chronic cough:  Infections.  Allergies.  Asthma.  Post-nasal drip.  Smoking.  Heartburn or acid reflux.  Some medicines.  Chronic lung problems (COPD).  Cancer. SYMPTOMS   Cough.  Fever.  Chest pain.  Increased breathing rate.  High-pitched whistling sound when breathing (wheezing).  Colored mucus that you cough up (sputum). TREATMENT   A bacterial cough may be treated with antibiotic medicine.  A viral cough must run its course and will not respond to antibiotics.  Your caregiver may recommend other treatments if you have a chronic cough. HOME CARE INSTRUCTIONS   Only take over-the-counter or prescription medicines for pain, discomfort, or fever as directed by your caregiver. Use cough suppressants only as directed by your caregiver.  Use a cold steam vaporizer or humidifier in your bedroom or home to help loosen secretions.  Sleep in a semi-upright position if your cough is worse at night.  Rest as needed.  Stop smoking if you smoke. SEEK IMMEDIATE MEDICAL CARE IF:   You have pus in your sputum.  Your cough starts to worsen.  You cannot control your cough with suppressants and are losing sleep.  You begin  coughing up blood.  You have difficulty breathing.  You develop pain which is getting worse or is uncontrolled with medicine.  You have a fever. MAKE SURE YOU:   Understand these instructions.  Will watch your condition.  Will get help right away if you are not doing well or get worse. Document Released: 04/13/2011 Document Revised: 01/07/2012 Document Reviewed: 04/13/2011 [facility] Patient Information 2015 [employer] [place]. This information is not intended to replace advice given to you by your health care provider. Make sure you discuss any questions you have with your health care provider.

## 2014-06-11 DIAGNOSIS — R635 Abnormal weight gain: Secondary | ICD-10-CM | POA: Insufficient documentation

## 2014-06-20 DIAGNOSIS — R41840 Attention and concentration deficit: Secondary | ICD-10-CM | POA: Insufficient documentation

## 2014-09-09 ENCOUNTER — Other Ambulatory Visit: Payer: Self-pay

## 2014-09-09 DIAGNOSIS — Z1231 Encounter for screening mammogram for malignant neoplasm of breast: Secondary | ICD-10-CM

## 2014-11-16 ENCOUNTER — Ambulatory Visit
Admission: RE | Admit: 2014-11-16 | Discharge: 2014-11-16 | Disposition: A | Discharge status: Home / Self Care | Payer: 59 | Source: Ambulatory Visit

## 2014-11-16 DIAGNOSIS — Z1231 Encounter for screening mammogram for malignant neoplasm of breast: Secondary | ICD-10-CM

## 2015-10-11 ENCOUNTER — Other Ambulatory Visit: Payer: Self-pay

## 2015-10-11 DIAGNOSIS — Z1231 Encounter for screening mammogram for malignant neoplasm of breast: Secondary | ICD-10-CM

## 2015-11-22 ENCOUNTER — Ambulatory Visit: Payer: 59

## 2015-11-24 ENCOUNTER — Ambulatory Visit
Admission: RE | Admit: 2015-11-24 | Discharge: 2015-11-24 | Disposition: A | Discharge status: Home / Self Care | Payer: 59 | Source: Ambulatory Visit

## 2015-11-24 DIAGNOSIS — Z1231 Encounter for screening mammogram for malignant neoplasm of breast: Secondary | ICD-10-CM

## 2016-10-24 ENCOUNTER — Other Ambulatory Visit: Payer: Self-pay | Admitting: Family Medicine

## 2016-10-25 ENCOUNTER — Other Ambulatory Visit: Payer: Self-pay | Admitting: Family Medicine

## 2016-10-25 DIAGNOSIS — Z1231 Encounter for screening mammogram for malignant neoplasm of breast: Secondary | ICD-10-CM

## 2016-11-27 ENCOUNTER — Ambulatory Visit
Admission: RE | Admit: 2016-11-27 | Discharge: 2016-11-27 | Disposition: A | Discharge status: Home / Self Care | Payer: BLUE CROSS/BLUE SHIELD | Source: Ambulatory Visit | Attending: Family Medicine | Admitting: Family Medicine

## 2016-11-27 DIAGNOSIS — Z1231 Encounter for screening mammogram for malignant neoplasm of breast: Secondary | ICD-10-CM

## 2017-06-20 ENCOUNTER — Ambulatory Visit (INDEPENDENT_AMBULATORY_CARE_PROVIDER_SITE_OTHER): Payer: Self-pay | Admitting: Nurse Practitioner

## 2017-06-20 VITALS — BP 122/64 | HR 82 | Temp 97.9°F | Resp 16

## 2017-06-20 DIAGNOSIS — W57XXXA Bitten or stung by nonvenomous insect and other nonvenomous arthropods, initial encounter: Secondary | ICD-10-CM

## 2017-06-20 DIAGNOSIS — L03116 Cellulitis of left lower limb: Secondary | ICD-10-CM

## 2017-06-20 MED ORDER — MUPIROCIN 2 % EX OINT: 1.0000 "application " | TOPICAL_OINTMENT | Freq: Two times a day (BID) | CUTANEOUS | 0 refills | Status: AC

## 2017-06-20 MED ORDER — EPINEPHRINE 0.3 MG/0.3ML IJ SOAJ: 0.3000 mg | Freq: Once | INTRAMUSCULAR | 1 refills | Status: AC

## 2017-06-20 MED ORDER — CEPHALEXIN 500 MG PO CAPS: 500.0000 mg | ORAL_CAPSULE | Freq: Two times a day (BID) | ORAL | 0 refills | Status: AC

## 2017-06-20 NOTE — Patient Instructions (Addendum)
Cellulitis, Adult Cellulitis is a skin infection. The infected area is usually red and tender. This condition occurs most often in the arms and lower legs. The infection can travel to the muscles, blood, and underlying tissue and become serious. It is very important to get treated for this condition. What are the causes? Cellulitis is caused by bacteria. The bacteria enter through a break in the skin, such as a cut, burn, insect bite, open sore, or crack. What increases the risk? This condition is more likely to occur in people who:  Have a weak defense system (immune system).  Have open wounds on the skin such as cuts, burns, bites, and scrapes. Bacteria can enter the body through these open wounds.  Are older.  Have diabetes.  Have a type of long-lasting (chronic) liver disease (cirrhosis) or kidney disease.  Use IV drugs.  What are the signs or symptoms? Symptoms of this condition include:  Redness, streaking, or spotting on the skin.  Swollen area of the skin.  Tenderness or pain when an area of the skin is touched.  Warm skin.  Fever.  Chills.  Blisters.  How is this diagnosed? This condition is diagnosed based on a medical history and physical exam. You may also have tests, including:  Blood tests.  Lab tests.  Imaging tests.  How is this treated? Treatment for this condition may include:  Medicines, such as antibiotic medicines or antihistamines.  Supportive care, such as rest and application of cold or warm cloths (cold or warm compresses) to the skin.  Hospital care, if the condition is severe.  The infection usually gets better within 1-2 days of treatment. Follow these instructions at home:  Take over-the-counter and prescription medicines only as told by your health care provider.  If you were prescribed an antibiotic medicine, take it as told by your health care provider. Do not stop taking the antibiotic even if you start to feel  better.  Drink enough fluid to keep your urine clear or pale yellow.  Do not touch or rub the infected area.  Raise (elevate) the infected area above the level of your heart while you are sitting or lying down.  Apply warm or cold compresses to the affected area as told by your health care provider.  Keep all follow-up visits as told by your health care provider. This is important. These visits let your health care provider make sure a more serious infection is not developing. Contact a health care provider if:  You have a fever.  Your symptoms do not improve within 1-2 days of starting treatment.  Your bone or joint underneath the infected area becomes painful after the skin has healed.  Your infection returns in the same area or another area.  You notice a swollen bump in the infected area.  You develop new symptoms.  You have a general ill feeling (malaise) with muscle aches and pains. Get help right away if:  Your symptoms get worse.  You feel very sleepy.  You develop vomiting or diarrhea that persists.  You notice red streaks coming from the infected area.  Your red area gets larger or turns dark in color. This information is not intended to replace advice given to you by your health care provider. Make sure you discuss any questions you have with your health care provider. Document Released: 07/25/2005 Document Revised: 02/23/2016 Document Reviewed: 08/24/2015 Elsevier Interactive Patient Education  2017 [employer]  Cellulitis, Adult Cellulitis is a skin infection. The infected  area is usually red and tender. This condition occurs most often in the arms and lower legs. The infection can travel to the muscles, blood, and underlying tissue and become serious. It is very important to get treated for this condition. What are the causes? Cellulitis is caused by bacteria. The bacteria enter through a break in the skin, such as a cut, burn, insect bite, open sore, or  crack. What increases the risk? This condition is more likely to occur in people who:  Have a weak defense system (immune system).  Have open wounds on the skin such as cuts, burns, bites, and scrapes. Bacteria can enter the body through these open wounds.  Are older.  Have diabetes.  Have a type of long-lasting (chronic) liver disease (cirrhosis) or kidney disease.  Use IV drugs.  What are the signs or symptoms? Symptoms of this condition include:  Redness, streaking, or spotting on the skin.  Swollen area of the skin.  Tenderness or pain when an area of the skin is touched.  Warm skin.  Fever.  Chills.  Blisters.  How is this diagnosed? This condition is diagnosed based on a medical history and physical exam. You may also have tests, including:  Blood tests.  Lab tests.  Imaging tests.  How is this treated? Treatment for this condition may include:  Medicines, such as antibiotic medicines or antihistamines.  Supportive care, such as rest and application of cold or warm cloths (cold or warm compresses) to the skin.  Hospital care, if the condition is severe.  The infection usually gets better within 1-2 days of treatment. Follow these instructions at home:  Take over-the-counter and prescription medicines only as told by your health care provider.  If you were prescribed an antibiotic medicine, take it as told by your health care provider. Do not stop taking the antibiotic even if you start to feel better.  Drink enough fluid to keep your urine clear or pale yellow.  Do not touch or rub the infected area.  Raise (elevate) the infected area above the level of your heart while you are sitting or lying down.  Apply warm or cold compresses to the affected area as told by your health care provider.  Keep all follow-up visits as told by your health care provider. This is important. These visits let your health care provider make sure a more serious  infection is not developing. Contact a health care provider if:  You have a fever.  Your symptoms do not improve within 1-2 days of starting treatment.  Your bone or joint underneath the infected area becomes painful after the skin has healed.  Your infection returns in the same area or another area.  You notice a swollen bump in the infected area.  You develop new symptoms.  You have a general ill feeling (malaise) with muscle aches and pains. Get help right away if:  Your symptoms get worse.  You feel very sleepy.  You develop vomiting or diarrhea that persists.  You notice red streaks coming from the infected area.  Your red area gets larger or turns dark in color. This information is not intended to replace advice given to you by your health care provider. Make sure you discuss any questions you have with your health care provider. Document Released: 07/25/2005 Document Revised: 02/23/2016 Document Reviewed: 08/24/2015 Elsevier Interactive Patient Education  2017 [employer] Inc[employer]  IT sales professional, Adult An insect bite can make your skin red, itchy, and swollen. An insect bite  is different from an insect sting, which happens when an insect injects poison (venom) into the skin. Some insects can spread disease to people through a bite. However, most insect bites do not lead to disease and are not serious. What are the causes? Insects may bite for a variety of reasons, including:  Hunger.  To defend themselves.  Insects that bite include:  Spiders.  Mosquitoes.  Ticks.  Fleas.  Ants.  Flies.  Bedbugs.  What are the signs or symptoms? Symptoms of this condition include:  Itching or pain in the bite area.  Redness and swelling in the bite area.  An open wound (skin ulcer).  In many cases, symptoms last for 2-4 days. How is this diagnosed? This condition is usually diagnosed based on symptoms and a physical exam. How is this treated? Treatment is usually  not needed. Symptoms often go away on their own. When treatment is recommended, it may involve:  Applying a cream or lotion to the bitten area. This treatment helps with itching.  Taking an antibiotic medicine. This treatment is needed if the bite area gets infected.  Getting a tetanus shot.  Applying ice to the affected area.  Medicines called antihistamines. This treatment is needed if you develop an allergic reaction to the insect bite.  Follow these instructions at home: Bite area care  Do not scratch the bite area.  Keep the bite area clean and dry. Wash it every day with soap and water as told by your health care provider.  Check the bite area every day for signs of infection. Check for: ? More redness, swelling, or pain. ? Fluid or blood. ? Warmth. ? Pus. Managing pain, itching, and swelling   You may apply a baking soda paste, cortisone cream, or calamine lotion to the bite area as told by your health care provider.  If directed, applyice to the bite area. ? Put ice in a plastic bag. ? Place a towel between your skin and the bag. ? Leave the ice on for 20 minutes, 2-3 times per day. Medicines  Apply or take over-the-counter and prescription medicines only as told by your health care provider.  If you were prescribed an antibiotic medicine, use it as told by your health care provider. Do not stop using the antibiotic even if your condition improves. General instructions  Keep all follow-up visits as told by your health care provider. This is important. How is this prevented? To help reduce your risk of insect bites:  When you are outdoors, wear clothing that covers your arms and legs.  Use insect repellent. The best insect repellents contain: ? DEET, picaridin, oil of lemon eucalyptus (OLE), or IR3535. ? Higher amounts of an active ingredient.  If your home windows do not have screens, consider installing them.  Contact a health care provider if:  You  have more redness, swelling, or pain in the bite area.  You have fluid, blood, or pus coming from the bite area.  The bite area feels warm to the touch.  You have a fever. Get help right away if:  You have joint pain.  You have a rash.  You have shortness of breath.  You feel unusually tired or sleepy.  You have neck pain.  You have a headache.  You have unusual weakness.  You have chest pain.  You have nausea, vomiting, or pain in the abdomen. This information is not intended to replace advice given to you by your health care provider. Make  sure you discuss any questions you have with your health care provider. Document Released: 11/22/2004 Document Revised: 06/13/2016 Document Reviewed: 04/23/2016 Elsevier Interactive Patient Education  [employer].

## 2017-06-20 NOTE — Progress Notes (Signed)
Subjective:  Tracey Faulkner is a 51 y.o. female who presents for evaluation of a possible wasp sting on Tuesday.  The patient denies fever and chills or difficulty breathing.  Patient has area to left posterior calf with swelling and warmth.  Patient took a tsp of  Children's Benadryl last pm.  Patient has a history of asthma and states she thinks this may have affected her asthma. + increased difficulty breathing, denies wheezing. Patient denies any other past medical history.  Patient has a history of food allergies only.  Review of Systems  Constitutional: Negative.   HENT: Negative.   Respiratory: Positive for shortness of breath. Negative for wheezing.   Skin: Positive for itching and rash.  Psychiatric/Behavioral: Negative.       Objective:  Physical Exam  Constitutional: She is oriented to person, place, and time. She appears well-developed and well-nourished. No distress.  HENT:  Head: Normocephalic and atraumatic.  Eyes: Pupils are equal, round, and reactive to light. Conjunctivae and EOM are normal.  Neck: Normal range of motion. Neck supple.  Cardiovascular: Normal rate, regular rhythm and normal heart sounds.   Pulmonary/Chest: Effort normal and breath sounds normal. No respiratory distress. She has no wheezes.  Neurological: She is alert and oriented to person, place, and time.  Skin: Skin is warm and dry. Capillary refill takes less than 2 seconds.  3cm x 3cm induration to posterior left calf, warm to touch, erythematous.  Clear to yellowish drainage with forming pustules.   Psychiatric: She has a normal mood and affect. Her behavior is normal. Judgment and thought content normal.      Assessment:  Insect Bite and Cellulitis to Left Lower Extremity      Plan:  Keflex and Mupiricon Ointment.  Xyzal or Benadryl as needed for itching.  Patient also given additional prescription for Epi-pen.  Patient to follow up in ER if fever, increasing redness, swelling or  drainage.  Patient verbalized understanding.

## 2017-06-24 ENCOUNTER — Telehealth: Payer: Self-pay | Admitting: Emergency Medicine

## 2017-10-18 ENCOUNTER — Other Ambulatory Visit: Payer: Self-pay | Admitting: Family Medicine

## 2017-10-18 DIAGNOSIS — Z1231 Encounter for screening mammogram for malignant neoplasm of breast: Secondary | ICD-10-CM

## 2017-11-28 ENCOUNTER — Ambulatory Visit
Admission: RE | Admit: 2017-11-28 | Discharge: 2017-11-28 | Disposition: A | Discharge status: Home / Self Care | Payer: BLUE CROSS/BLUE SHIELD | Source: Ambulatory Visit | Attending: Family Medicine | Admitting: Family Medicine

## 2017-11-28 DIAGNOSIS — Z1231 Encounter for screening mammogram for malignant neoplasm of breast: Secondary | ICD-10-CM

## 2017-12-24 ENCOUNTER — Encounter: Payer: Self-pay | Admitting: Emergency Medicine

## 2017-12-24 ENCOUNTER — Ambulatory Visit: Payer: Self-pay | Admitting: Emergency Medicine

## 2017-12-24 VITALS — BP 120/72 | HR 89 | Temp 98.7°F | Resp 16

## 2017-12-24 DIAGNOSIS — B356 Tinea cruris: Secondary | ICD-10-CM

## 2017-12-24 MED ORDER — KETOCONAZOLE 2 % EX CREA: 1.0000 "application " | TOPICAL_CREAM | Freq: Every day | CUTANEOUS | 0 refills | Status: DC

## 2017-12-24 NOTE — Patient Instructions (Signed)
Tinea Versicolor Tinea versicolor is a skin infection that is caused by a type of yeast. It causes a rash that shows up as light or dark patches on the skin. It often occurs on the chest, back, neck, or upper arms. The condition usually does not cause other problems. In most cases, it goes away in a few weeks with treatment. The infection cannot be spread by person to another person. Follow these instructions at home:  Take medicines only as told by your doctor.  Scrub your skin every day with a dandruff shampoo as told by your doctor.  Do not scratch your skin in the rash area.  Avoid places that are hot and humid.  Do not use tanning booths.  Try to avoid sweating a lot. Contact a doctor if:  Your symptoms get worse.  You have a fever.  You have redness, swelling, or pain in the area of your rash.  You have fluid, blood, or pus coming from your rash.  Your rash comes back after treatment. This information is not intended to replace advice given to you by your health care provider. Make sure you discuss any questions you have with your health care provider. Document Released: 09/27/2008 Document Revised: 06/17/2016 Document Reviewed: 07/27/2014 Elsevier Interactive Patient Education  2018 [employer]  

## 2017-12-24 NOTE — Progress Notes (Signed)
S: Tracey Faulkner is a 52 y.o. female who presents for rash in her groin area. She reports it has been present for 1-2 weeks, is puritic and weeping. She has been treating it with OTC antifungals, and states these have helped the symptoms and has decreased in size. Has no history of diabetes, recent steroid use, or antibiotic use.   Review of Systems  Constitutional: Negative for chills, fever and malaise/fatigue.  Gastrointestinal: Negative.   Genitourinary: Negative.   Musculoskeletal: Negative.   Skin: Positive for itching and rash.   O: Vitals:   12/24/17 1248  BP: 120/72  Pulse: 89  Resp: 16  Temp: 98.7 F (37.1 C)  SpO2: 96%   Physical Exam  Constitutional: She appears well-developed and well-nourished. No distress.  Genitourinary:     Genitourinary Comments: Exam Chaperoned by Raynald KempMariela Winthrow, CMA Erythematous plaques with scale noted in inguinal area  Neurological: She is alert.  Skin: Skin is warm and dry. Capillary refill takes less than 2 seconds. Rash noted. She is not diaphoretic.  Nursing note and vitals reviewed.  A: 1. Tinea cruris     P: 1. Tinea cruris -Keep the area clean and dry, wear loose fitting clothing, return as needed. - ketoconazole (NIZORAL) 2 % cream; Apply 1 application topically daily.  Dispense: 15 g; Refill: 0

## 2017-12-30 ENCOUNTER — Ambulatory Visit: Payer: Self-pay | Admitting: Psychiatry

## 2017-12-30 ENCOUNTER — Encounter: Payer: Self-pay | Admitting: Psychiatry

## 2017-12-30 DIAGNOSIS — B356 Tinea cruris: Secondary | ICD-10-CM

## 2017-12-30 MED ORDER — KETOCONAZOLE 2 % EX CREA: 1.0000 "application " | TOPICAL_CREAM | Freq: Every day | CUTANEOUS | 0 refills | Status: AC

## 2017-12-30 NOTE — Progress Notes (Signed)
Patient was seen her about a week ago for complaints of rash, she was treated for tinea cruris and was prescribed ketoconazole. She's here today requesting for a refill for the mediation. She stated that she had moderate relief with the medication but she ran out of it fast because she was using it BID as supposed to daily. She stated that it didn't work with daily application that she noticed it was migrating down but BID worked better. She denies any other symptoms. Patient reports that she never had this before but she moved to a new place with a new well water.   Review of Systems  Constitutional: Negative for chills, fever and malaise/fatigue.  Gastrointestinal: Negative.   Genitourinary: Negative.   Musculoskeletal: Negative.   Skin: Positive for itching and rash.   O: Vitals:   12/30/17 1113  BP: 118/72  Pulse: 81  Resp: 18  Temp: 98.4 F (36.9 C)  SpO2: 98%   Physical Exam  Constitutional: She appears well-developed and well-nourished. No distress.  Neurological: She is alert.  Skin: Skin is warm and dry. Capillary refill takes less than 2 seconds. Rash noted. She is not diaphoretic.  Nursing note and vitals reviewed.  Diagnoses and all orders for this visit:  Tinea cruris -     ketoconazole (NIZORAL) 2 % cream; Apply 1 application topically daily.   1. Tinea cruris -Keep the area clean and dry, wear loose fitting clothing, return as needed. - ketoconazole (NIZORAL) 2 % cream; Apply 1 application topically daily.  Dispense: 60 g; Refill: 0

## 2018-01-03 ENCOUNTER — Telehealth: Payer: Self-pay

## 2018-01-03 NOTE — Telephone Encounter (Signed)
Called and spoke with pt regarding her visit and she states that she is doing better and everything has healed up.

## 2018-11-11 ENCOUNTER — Other Ambulatory Visit: Payer: Self-pay | Admitting: Family Medicine

## 2018-11-11 DIAGNOSIS — Z1231 Encounter for screening mammogram for malignant neoplasm of breast: Secondary | ICD-10-CM

## 2018-12-16 ENCOUNTER — Ambulatory Visit: Payer: BLUE CROSS/BLUE SHIELD

## 2019-01-14 ENCOUNTER — Other Ambulatory Visit: Payer: Self-pay

## 2019-01-14 ENCOUNTER — Ambulatory Visit
Admission: RE | Admit: 2019-01-14 | Discharge: 2019-01-14 | Disposition: A | Discharge status: Home / Self Care | Payer: BLUE CROSS/BLUE SHIELD | Source: Ambulatory Visit | Attending: Family Medicine | Admitting: Family Medicine

## 2019-01-14 DIAGNOSIS — Z1231 Encounter for screening mammogram for malignant neoplasm of breast: Secondary | ICD-10-CM

## 2019-12-18 ENCOUNTER — Other Ambulatory Visit: Payer: Self-pay | Admitting: Family Medicine

## 2019-12-18 DIAGNOSIS — Z1231 Encounter for screening mammogram for malignant neoplasm of breast: Secondary | ICD-10-CM

## 2020-01-26 ENCOUNTER — Ambulatory Visit: Payer: BLUE CROSS/BLUE SHIELD

## 2020-02-11 ENCOUNTER — Ambulatory Visit
Admission: RE | Admit: 2020-02-11 | Discharge: 2020-02-11 | Disposition: A | Discharge status: Home / Self Care | Payer: BLUE CROSS/BLUE SHIELD | Source: Ambulatory Visit | Attending: Family Medicine | Admitting: Family Medicine

## 2020-02-11 ENCOUNTER — Other Ambulatory Visit: Payer: Self-pay

## 2020-02-11 DIAGNOSIS — Z1231 Encounter for screening mammogram for malignant neoplasm of breast: Secondary | ICD-10-CM

## 2021-01-26 ENCOUNTER — Other Ambulatory Visit: Payer: Self-pay | Admitting: Family Medicine

## 2021-01-26 DIAGNOSIS — Z1231 Encounter for screening mammogram for malignant neoplasm of breast: Secondary | ICD-10-CM

## 2021-03-20 ENCOUNTER — Ambulatory Visit: Payer: BC Managed Care – PPO

## 2021-03-21 ENCOUNTER — Other Ambulatory Visit: Payer: Self-pay

## 2021-03-21 ENCOUNTER — Ambulatory Visit
Admission: RE | Admit: 2021-03-21 | Discharge: 2021-03-21 | Disposition: A | Discharge status: Home / Self Care | Payer: BC Managed Care – PPO | Source: Ambulatory Visit | Attending: Family Medicine | Admitting: Family Medicine

## 2021-03-21 DIAGNOSIS — Z1231 Encounter for screening mammogram for malignant neoplasm of breast: Secondary | ICD-10-CM

## 2021-03-30 IMAGING — MG DIGITAL SCREENING BREAST BILAT IMPLANT W/ TOMO W/ CAD
9 of 12 series · 9 of 28 positions shown · non-contrast
Comparison: Prior films

CLINICAL DATA: Screening.

EXAM:
DIGITAL SCREENING BILATERAL MAMMOGRAM WITH IMPLANTS, CAD AND TOMO
The patient has implants. Standard and implant displaced views were
performed.

[R CC]
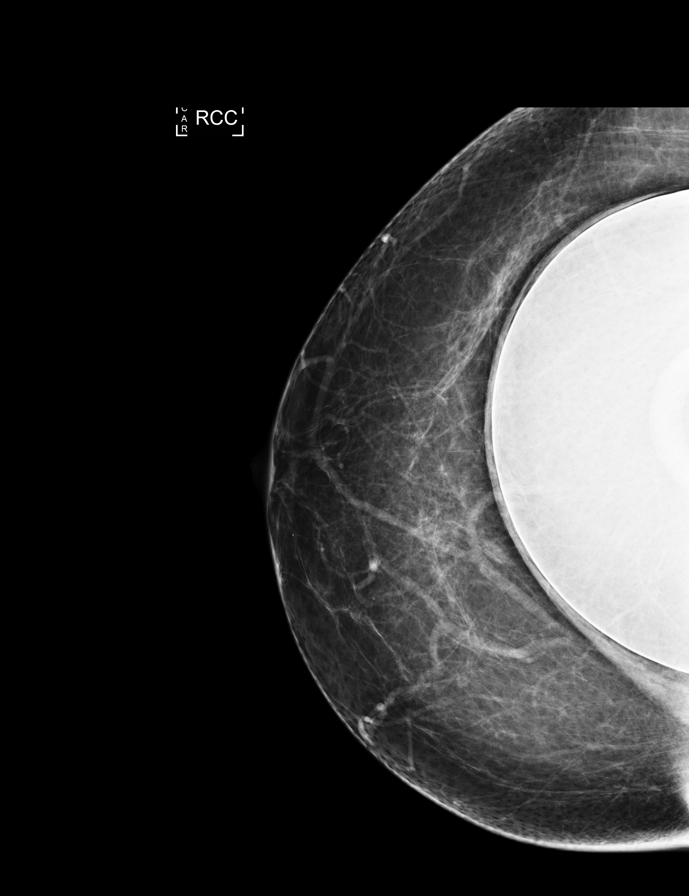

[R MLO]
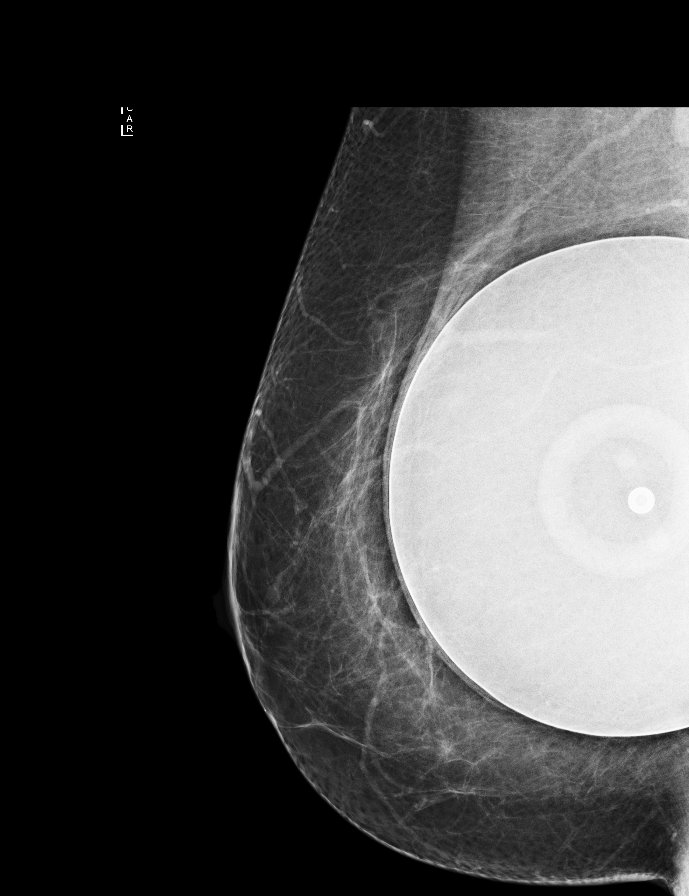

[L MLO]
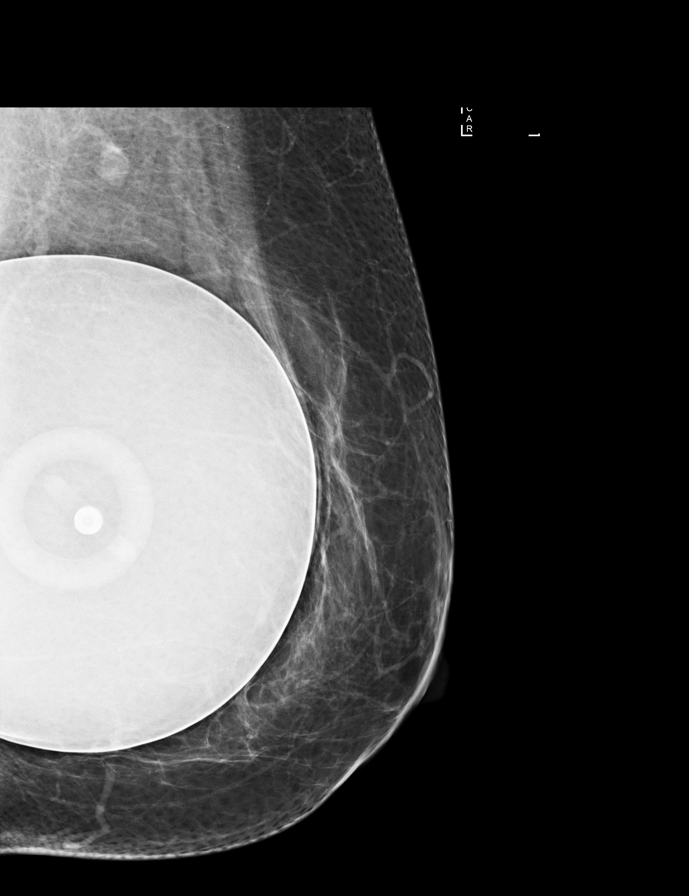

[L CC]
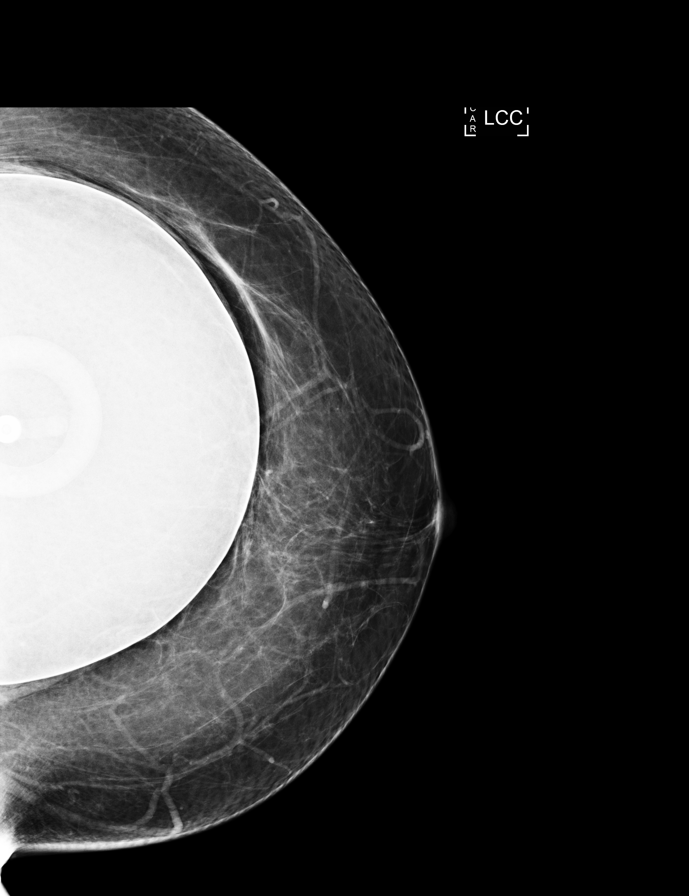

[R CC synth-2D]
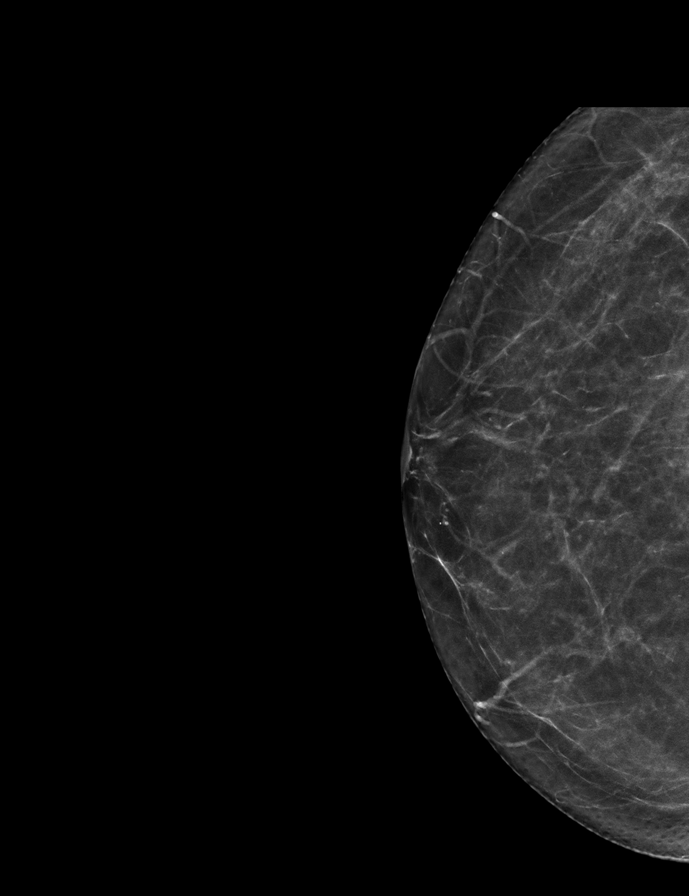

[L MLO synth-2D]
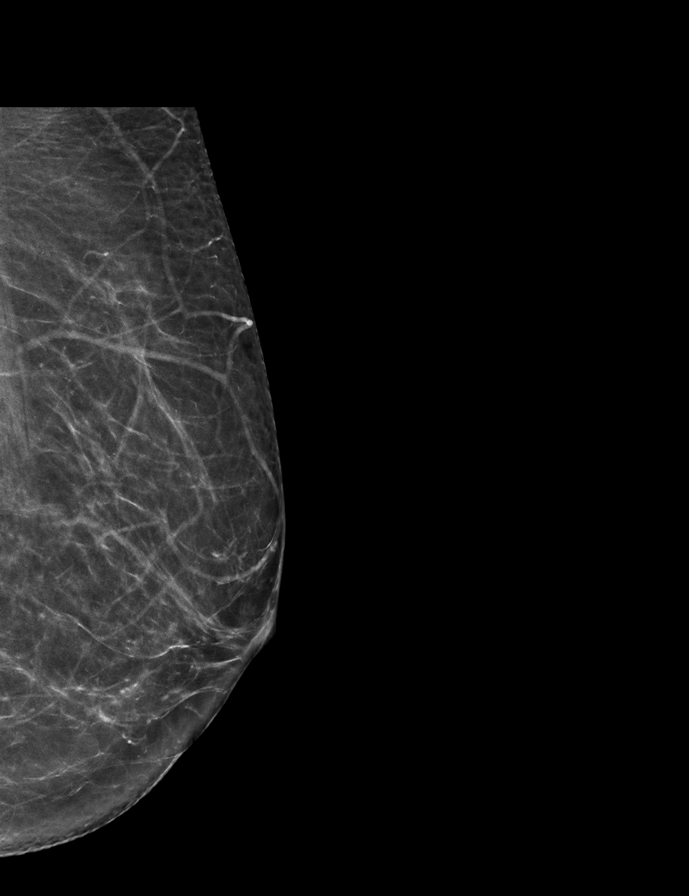

[R MLO synth-2D]
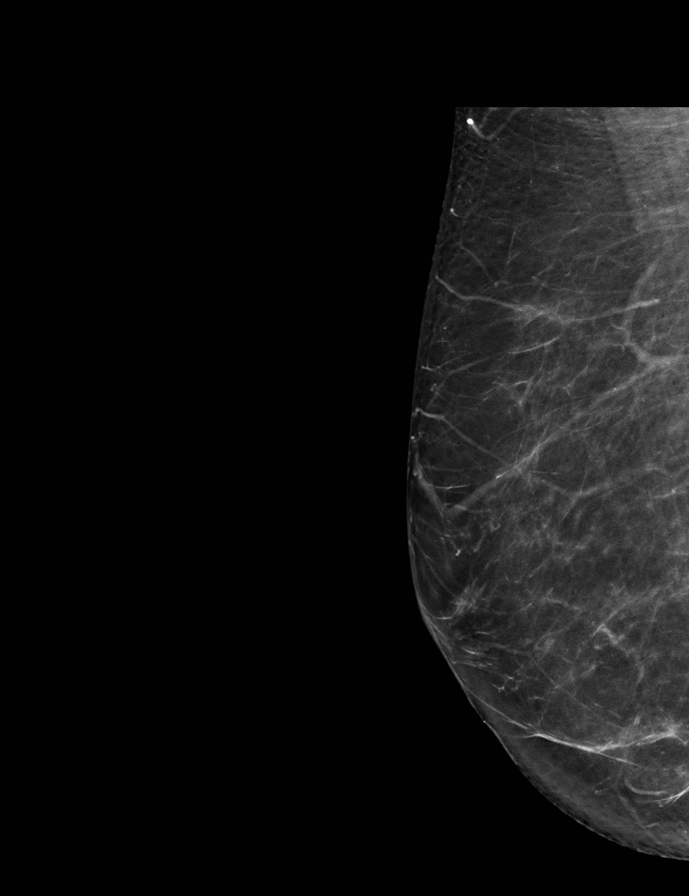

[L CC synth-2D]
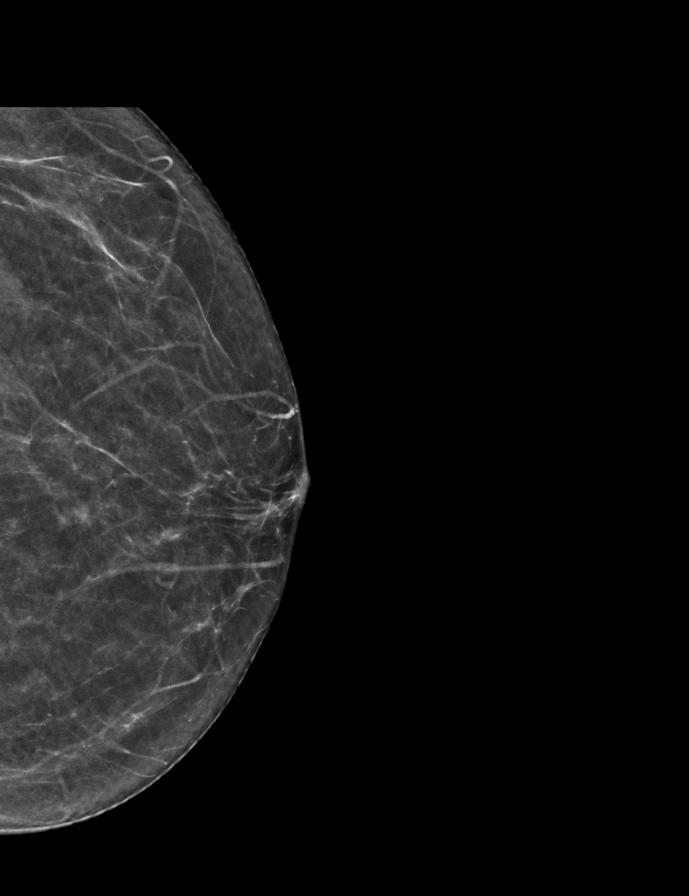

[L CCID BREAST TOMOSYNTHESIS IMAGE tomo · tomo slice 29/57.0]
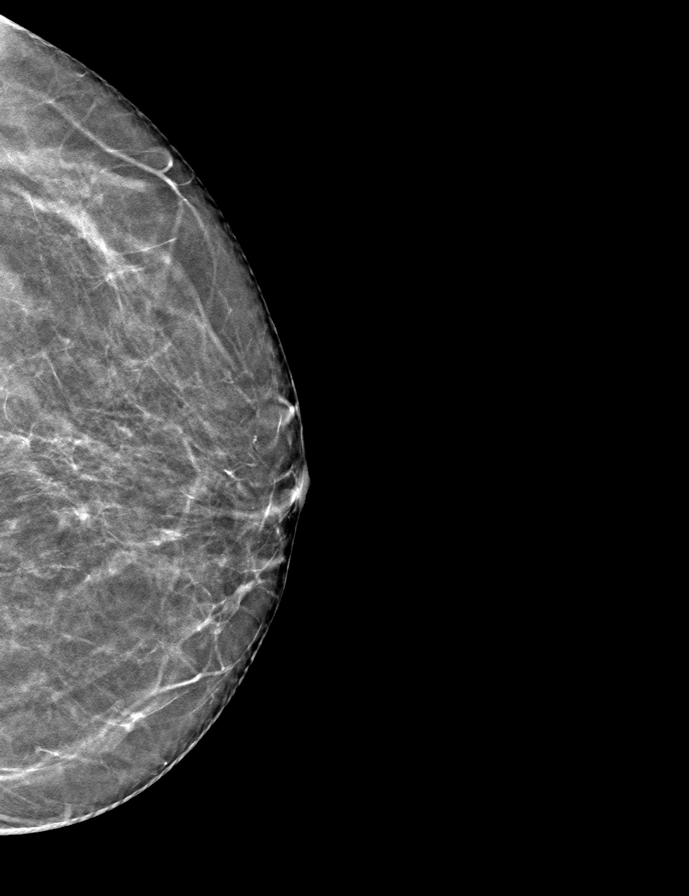

[9 of 28 positions shown; findings below may reference images not displayed]

ACR Breast Density Category b: There are scattered areas of
fibroglandular density.
FINDINGS: There are no findings suspicious for malignancy. Images were
processed with CAD.
IMPRESSION: No mammographic evidence of malignancy. A result letter of this
screening mammogram will be mailed directly to the patient.

RECOMMENDATION:
Screening mammogram in one year. (Code:[id number]-659)

BI-RADS CATEGORY  1:  Negative.

## 2021-06-29 ENCOUNTER — Other Ambulatory Visit: Payer: Self-pay

## 2021-06-29 ENCOUNTER — Ambulatory Visit
Admission: RE | Admit: 2021-06-29 | Discharge: 2021-06-29 | Disposition: A | Discharge status: Home / Self Care | Payer: BC Managed Care – PPO | Source: Ambulatory Visit

## 2021-06-29 VITALS — BP 106/71 | HR 65 | Temp 97.8°F | Resp 18

## 2021-06-29 DIAGNOSIS — W57XXXA Bitten or stung by nonvenomous insect and other nonvenomous arthropods, initial encounter: Secondary | ICD-10-CM | POA: Diagnosis not present

## 2021-06-29 DIAGNOSIS — S90464A Insect bite (nonvenomous), right lesser toe(s), initial encounter: Secondary | ICD-10-CM

## 2021-06-29 DIAGNOSIS — S90421A Blister (nonthermal), right great toe, initial encounter: Secondary | ICD-10-CM

## 2021-06-29 MED ORDER — DOXYCYCLINE HYCLATE 100 MG PO CAPS: 100.0000 mg | ORAL_CAPSULE | Freq: Two times a day (BID) | ORAL | 0 refills | Status: AC

## 2021-06-29 MED ORDER — TRIAMCINOLONE ACETONIDE 0.1 % EX CREA: 1.0000 "application " | TOPICAL_CREAM | Freq: Two times a day (BID) | CUTANEOUS | 0 refills | Status: AC

## 2021-06-29 NOTE — ED Provider Notes (Signed)
[facility]    CSN: [id number] Arrival date & time: 06/29/21  1205      History   Chief Complaint Chief Complaint  Patient presents with   Insect Bite    HPI Tracey Faulkner is a 55 y.o. female presenting today for evaluation of a bug bite.  Patient reports over the past week she has had a bug bite to her right little toe which has been associated with itching.  Over the past couple days has developed increased discomfort underneath her right great toe.  Noticed last night it was a blister and her husband popped it and has had some clear drainage since.  She denies fevers.  Reports history of similar with past tick bites and bug bites.  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      Home Medications    Prior to Admission medications   Medication Sig Start Date End Date Taking? Authorizing Provider  doxycycline (VIBRAMYCIN) 100 MG capsule Take 1 capsule (100 mg total) by mouth 2 (two) times daily for 7 days. 06/29/21 07/06/21 Yes Arkie Tagliaferro C, PA-C  fluticasone-salmeterol (ADVAIR DISKUS) 250-50 MCG/ACT AEPB Inhale into the lungs. 11/22/16 03/01/22 Yes [provider]  triamcinolone cream (KENALOG) 0.1 % Apply 1 application topically 2 (two) times daily. 06/29/21  Yes Joniyah Mallinger C, PA-C  albuterol (PROVENTIL HFA;VENTOLIN HFA) 108 (90 BASE) MCG/ACT inhaler Inhale 2 puffs into the lungs every 6  (six) hours as needed for wheezing or shortness of breath. 03/23/14 03/30/15  Zanard, Hinton Dyer, MD  benzonatate (TESSALON) 200 MG capsule Take 1 capsule (200 mg total) by mouth 3 (three) times daily as needed for cough. 05/26/14   Gillian Scarce, MD  chlorpheniramine-HYDROcodone (TUSSIONEX PENNKINETIC ER) 10-8 MG/5ML LQCR Take 5 mLs by mouth every 12 (twelve) hours as needed. 05/26/14   Zanard, Hinton Dyer, MD  ketoconazole (NIZORAL) 2 % cream Apply 1 application topically daily. 12/30/17   Ferne Reus A, NP  lisdexamfetamine (VYVANSE) 50 MG capsule Take 1 capsule (50 mg total) by mouth daily. 05/26/14 05/27/15  Zanard, Hinton Dyer, MD  montelukast (SINGULAIR) 10 MG tablet Take 1 tablet (10 mg total) by mouth at bedtime. 03/23/14   Gillian Scarce, MD  mupirocin ointment (BACTROBAN) 2 % Place 1 application into the nose 2 (two) times daily. 06/20/17   Benay Pike, NP    Family History Family History  Problem Relation Age of Onset   Asthma Sister    Osteoporosis Mother     Social History Social History   Tobacco Use   Smoking status: Never   Smokeless tobacco: Never  Substance Use Topics   Alcohol use: Yes    Comment: Occasional    Drug use: No     Allergies   Lactase and Amoxicillin-pot clavulanate   Review of Systems Review of Systems  Constitutional:  Negative for fatigue and fever.  HENT:  Negative for mouth sores.   Eyes:  Negative for visual disturbance.  Respiratory:  Negative for shortness of breath.   Cardiovascular:  Negative for chest pain.  Gastrointestinal:  Negative for abdominal pain, nausea and vomiting.  Genitourinary:  Negative for genital sores.  Musculoskeletal:  Negative for arthralgias and joint swelling.  Skin:  Positive for color change. Negative for rash and wound.  Neurological:  Negative for dizziness, weakness, light-headedness and headaches.    Physical Exam Triage Vital Signs ED Triage Vitals  Enc Vitals Group     BP      Pulse      Resp       Temp      Temp src      SpO2      Weight      Height      Head Circumference      Peak Flow      Pain Score      Pain Loc      Pain Edu?      Excl. in GC?    No data found.  Updated Vital Signs BP 106/71 (BP Location: Right Arm)   Pulse 65   Temp 97.8 F (36.6 C) (Oral)   Resp 18   LMP 04/06/2014   SpO2 97%   Visual Acuity Right Eye Distance:   Left Eye Distance:   Bilateral Distance:    Right Eye Near:   Left Eye Near:    Bilateral Near:     Physical Exam Vitals and nursing note reviewed.  Constitutional:      Appearance: She is well-developed.     Comments: No acute distress  HENT:     Head: Normocephalic and atraumatic.     Nose: Nose normal.  Eyes:     Conjunctiva/sclera: Conjunctivae normal.  Cardiovascular:     Rate and Rhythm: Normal rate.  Pulmonary:     Effort: Pulmonary effort is normal. No respiratory distress.  Abdominal:     General: There is no distension.  Musculoskeletal:        General: Normal range of motion.     Cervical back: Neck supple.  Skin:    General: Skin is warm and dry.     Comments: Right little toe with small area of erythema and central scabbing noted  Plantar surface of right great toe with raised area appearing slightly more white and draining clear fluid, no erythema  Neurological:     Mental Status: She is alert and oriented to person, place, and time.     UC Treatments / Results  Labs (all labs ordered are listed, but only abnormal results are displayed) Labs Reviewed - No data to display  EKG   Radiology No results found.  Procedures Procedures (including critical care time)  Medications Ordered in UC Medications - No data to display  Initial Impression / Assessment and Plan / UC Course  I have reviewed the triage vital signs and the nursing notes.  Pertinent labs & imaging results that were available during my care of the patient were reviewed by me and considered in my medical decision making  (see chart for details).     Appears to have insect bite of right little toe and blister of great toe, covering with doxycycline to cover for possible early abscess/infection, triamcinolone topically to help with itching, recommended elevation, letting foot air out as well as soaking in warm water and drying well afterwards.  Discussed strict return precautions. Patient verbalized understanding and is agreeable with plan.  Final Clinical Impressions(s) / UC Diagnoses   Final diagnoses:  Blister of great toe of right foot, initial encounter  Insect bite of lesser toe of right foot, initial encounter     Discharge Instructions      Begin doxycycline twice daily for 1 week Triamcinolone twice daily to areas of itching Soak foot in warm water, dry well afterwards Elevate foot and let air out Tylenol and ibuprofen for pain Follow-up if not improving or worsening     ED Prescriptions     Medication Sig Dispense Auth. Provider   triamcinolone cream (KENALOG) 0.1 % Apply 1 application topically 2 (two) times daily. 30 g Georgene Kopper C, PA-C   doxycycline (VIBRAMYCIN) 100 MG capsule Take 1 capsule (100 mg total) by mouth 2 (two) times daily for 7 days. 14 capsule Logun Colavito, Linwood C, PA-C      PDMP not reviewed this encounter.   Lew Dawes, PA-C 06/29/21 1233

## 2021-06-29 NOTE — Discharge Instructions (Addendum)
Begin doxycycline twice daily for 1 week Triamcinolone twice daily to areas of itching Soak foot in warm water, dry well afterwards Elevate foot and let air out Tylenol and ibuprofen for pain Follow-up if not improving or worsening

## 2021-06-29 NOTE — ED Triage Notes (Signed)
Pt reports blister on right little toe, was bitten by bug last week.  Area is painful and irritated per patient.

## 2022-01-03 ENCOUNTER — Other Ambulatory Visit: Payer: Self-pay | Admitting: Family Medicine

## 2022-01-03 DIAGNOSIS — Z1231 Encounter for screening mammogram for malignant neoplasm of breast: Secondary | ICD-10-CM

## 2022-03-22 ENCOUNTER — Ambulatory Visit
Admission: RE | Admit: 2022-03-22 | Discharge: 2022-03-22 | Disposition: A | Discharge status: Home / Self Care | Payer: BC Managed Care – PPO | Source: Ambulatory Visit | Attending: Family Medicine | Admitting: Family Medicine

## 2022-03-22 DIAGNOSIS — Z1231 Encounter for screening mammogram for malignant neoplasm of breast: Secondary | ICD-10-CM

## 2023-02-06 ENCOUNTER — Other Ambulatory Visit: Payer: Self-pay | Admitting: Obstetrics and Gynecology

## 2023-02-06 DIAGNOSIS — Z0000 Encounter for general adult medical examination without abnormal findings: Secondary | ICD-10-CM

## 2023-03-26 ENCOUNTER — Ambulatory Visit: Payer: BC Managed Care – PPO

## 2023-04-11 ENCOUNTER — Ambulatory Visit
Admission: RE | Admit: 2023-04-11 | Discharge: 2023-04-11 | Disposition: A | Discharge status: Home / Self Care | Payer: BC Managed Care – PPO | Source: Ambulatory Visit | Attending: Obstetrics and Gynecology | Admitting: Obstetrics and Gynecology

## 2023-04-11 DIAGNOSIS — Z0000 Encounter for general adult medical examination without abnormal findings: Secondary | ICD-10-CM

## 2023-04-15 ENCOUNTER — Other Ambulatory Visit: Payer: Self-pay | Admitting: Obstetrics and Gynecology

## 2023-04-15 DIAGNOSIS — R928 Other abnormal and inconclusive findings on diagnostic imaging of breast: Secondary | ICD-10-CM

## 2023-04-20 ENCOUNTER — Ambulatory Visit
Admission: RE | Admit: 2023-04-20 | Discharge: 2023-04-20 | Disposition: A | Discharge status: Home / Self Care | Payer: BC Managed Care – PPO | Source: Ambulatory Visit | Attending: Obstetrics and Gynecology | Admitting: Obstetrics and Gynecology

## 2023-04-20 DIAGNOSIS — R928 Other abnormal and inconclusive findings on diagnostic imaging of breast: Secondary | ICD-10-CM

## 2024-03-19 ENCOUNTER — Other Ambulatory Visit: Payer: Self-pay | Admitting: Obstetrics and Gynecology

## 2024-03-19 DIAGNOSIS — Z1231 Encounter for screening mammogram for malignant neoplasm of breast: Secondary | ICD-10-CM

## 2024-04-21 ENCOUNTER — Ambulatory Visit
Admission: RE | Admit: 2024-04-21 | Discharge: 2024-04-21 | Disposition: A | Discharge status: Home / Self Care | Source: Ambulatory Visit | Attending: Obstetrics and Gynecology | Admitting: Obstetrics and Gynecology

## 2024-04-21 DIAGNOSIS — Z1231 Encounter for screening mammogram for malignant neoplasm of breast: Secondary | ICD-10-CM

## 2024-11-11 ENCOUNTER — Telehealth: Admitting: Family Medicine

## 2024-11-11 DIAGNOSIS — J4540 Moderate persistent asthma, uncomplicated: Secondary | ICD-10-CM

## 2024-11-11 DIAGNOSIS — R051 Acute cough: Secondary | ICD-10-CM

## 2024-11-11 DIAGNOSIS — B9689 Other specified bacterial agents as the cause of diseases classified elsewhere: Secondary | ICD-10-CM

## 2024-11-11 DIAGNOSIS — J019 Acute sinusitis, unspecified: Secondary | ICD-10-CM | POA: Diagnosis not present

## 2024-11-11 MED ORDER — BENZONATATE 200 MG PO CAPS: 200.0000 mg | ORAL_CAPSULE | Freq: Three times a day (TID) | ORAL | 0 refills | Status: AC | PRN

## 2024-11-11 MED ORDER — DOXYCYCLINE HYCLATE 100 MG PO TABS: 100.0000 mg | ORAL_TABLET | Freq: Two times a day (BID) | ORAL | 0 refills | Status: AC

## 2024-11-11 MED ORDER — IPRATROPIUM-ALBUTEROL 0.5-2.5 (3) MG/3ML IN SOLN: 3.0000 mL | Freq: Four times a day (QID) | RESPIRATORY_TRACT | 0 refills | Status: AC | PRN

## 2024-11-11 NOTE — Progress Notes (Signed)
 " Virtual Visit Consent   Tracey Faulkner, you are scheduled for a virtual visit with a [facility] provider today. Just as with appointments in the office, your consent must be obtained to participate. Your consent will be active for this visit and any virtual visit you may have with one of our providers in the next 365 days. If you have a MyChart account, a copy of this consent can be sent to you electronically.  As this is a virtual visit, video technology does not allow for your provider to perform a traditional examination. This may limit your provider's ability to fully assess your condition. If your provider identifies any concerns that need to be evaluated in person or the need to arrange testing (such as labs, EKG, etc.), we will make arrangements to do so. Although advances in technology are sophisticated, we cannot ensure that it will always work on either your end or our end. If the connection with a video visit is poor, the visit may have to be switched to a telephone visit. With either a video or telephone visit, we are not always able to ensure that we have a secure connection.  By engaging in this virtual visit, you consent to the provision of healthcare and authorize for your insurance to be billed (if applicable) for the services provided during this visit. Depending on your insurance coverage, you may receive a charge related to this service.  I need to obtain your verbal consent now. Are you willing to proceed with your visit today? Tracey Faulkner has provided verbal consent on 11/11/2024 for a virtual visit (video or telephone). Loa Lamp, FNP  Date: 11/11/2024 5:48 PM   Virtual Visit via Video Note   I, Loa Lamp, connected with  Tracey Faulkner  ([id number], 11-14-1965) on 11/11/2024 at  5:45 PM EST by a video-enabled telemedicine application and verified that I am speaking with the correct person using two identifiers.  Location: Patient: Virtual Visit  Location Patient: Home Provider: Virtual Visit Location Provider: Home Office   I discussed the limitations of evaluation and management by telemedicine and the availability of in person appointments. The patient expressed understanding and agreed to proceed.    History of Present Illness: Tracey Faulkner is a 59 y.o. who identifies as a female who was assigned female at birth, and is being seen today for a cough starting a week ago, sx worsening, thick green mucus. History of asthma. Sinus pressure and pain. Chest sore. Wheezing but not sob. No fever now. Has albuterol . Needs refill on nebs. Declines prednisone.  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  Resting comfortably  at home.  Head is normocephalic, atraumatic.  No labored breathing.  Speech is clear and coherent with logical content.  Patient is alert and oriented at baseline.    Assessment and Plan: 1. 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 Moderate persistent asthma without complication  3. Acute bacterial sinusitis  4. Cough - benzonatate  (TESSALON ) 200 MG capsule; Take 1 capsule (200 mg total) by mouth 3 (three) times daily as needed for up to 10 days for cough.  Dispense: 30 capsule; Refill: 0  Increase fluids, humidifier at night, UC as  needed.   Follow Up Instructions: I discussed the assessment and treatment plan with the patient. The patient was provided an opportunity to ask questions and all were answered. The patient agreed with the plan and demonstrated an understanding of the instructions.  A  copy of instructions were sent to the patient via MyChart unless otherwise noted below.     The patient was advised to call back or seek an in-person evaluation if the symptoms worsen or if the condition fails to improve as anticipated.    [name], FNP     [1]  Allergies Allergen Reactions   Tilactase Other (See Comments)   Amoxicillin-Pot Clavulanate Nausea Only  [2]  Current Outpatient Medications:    doxycycline  (VIBRA -TABS) 100 MG tablet, Take 1 tablet (100 mg total) by mouth 2 (two) times daily for 10 days., Disp: 20 tablet, Rfl: 0   ipratropium-albuterol  (DUONEB) 0.5-2.5 (3) MG/3ML SOLN, Take 3 mLs by nebulization every 6 (six) hours as needed., Disp: 360 mL, Rfl: 0   albuterol  (PROVENTIL  HFA;VENTOLIN  HFA) 108 (90 BASE) MCG/ACT inhaler, Inhale 2 puffs into the lungs every 6 (six) hours as needed for wheezing or shortness of breath., Disp: 1 Inhaler, Rfl: 11   benzonatate  (TESSALON ) 200 MG capsule, Take 1 capsule (200 mg total) by mouth 3 (three) times daily as needed for up to 10 days for cough., Disp: 30 capsule, Rfl: 0   chlorpheniramine-HYDROcodone (TUSSIONEX PENNKINETIC ER) 10-8 MG/5ML LQCR, Take 5 mLs by mouth every 12 (twelve) hours as needed., Disp: 240 mL, Rfl: 0   ketoconazole  (NIZORAL ) 2 % cream, Apply 1 application topically daily., Disp: 60 g, Rfl: 0   lisdexamfetamine  (VYVANSE ) 50 MG capsule, Take 1 capsule (50 mg total) by mouth daily., Disp: 30 capsule, Rfl: 0   montelukast  (SINGULAIR ) 10 MG tablet, Take 1 tablet (10 mg total) by mouth at bedtime., Disp: 30 tablet, Rfl: 11   mupirocin  ointment (BACTROBAN ) 2 %, Place 1 application into the nose 2 (two) times daily., Disp: 22 g, Rfl: 0   triamcinolone  cream (KENALOG ) 0.1 %, Apply 1 application topically 2 (two) times daily., Disp: 30 g, Rfl: 0  "

## 2024-11-11 NOTE — Patient Instructions (Signed)
